# Patient Record
Sex: Female | Born: 1985
Health system: Southern US, Community
[De-identification: ages and names within clinical notes are randomized; demographics above are authoritative.]

## PROBLEM LIST (undated history)

## (undated) DIAGNOSIS — J189 Pneumonia, unspecified organism: Secondary | ICD-10-CM

## (undated) DIAGNOSIS — O2230 Deep phlebothrombosis in pregnancy, unspecified trimester: Secondary | ICD-10-CM

## (undated) DIAGNOSIS — J302 Other seasonal allergic rhinitis: Secondary | ICD-10-CM

## (undated) DIAGNOSIS — F419 Anxiety disorder, unspecified: Secondary | ICD-10-CM

## (undated) DIAGNOSIS — K219 Gastro-esophageal reflux disease without esophagitis: Secondary | ICD-10-CM

## (undated) DIAGNOSIS — G8929 Other chronic pain: Secondary | ICD-10-CM

## (undated) DIAGNOSIS — D649 Anemia, unspecified: Secondary | ICD-10-CM

## (undated) DIAGNOSIS — R011 Cardiac murmur, unspecified: Secondary | ICD-10-CM

## (undated) DIAGNOSIS — F32A Depression, unspecified: Secondary | ICD-10-CM

## (undated) DIAGNOSIS — R51 Headache: Secondary | ICD-10-CM

## (undated) HISTORY — DX: Cardiac murmur, unspecified: R01.1

## (undated) HISTORY — PX: VARICOSE VEIN SURGERY: SHX832

## (undated) HISTORY — DX: Headache: R51

## (undated) HISTORY — PX: DILATION AND CURETTAGE OF UTERUS: SHX78

## (undated) HISTORY — DX: Other chronic pain: G89.29

## (undated) HISTORY — PX: WISDOM TOOTH EXTRACTION: SHX21

## (undated) HISTORY — DX: Other seasonal allergic rhinitis: J30.2

---

## 2007-05-23 ENCOUNTER — Emergency Department (HOSPITAL_COMMUNITY): Admission: EM | Admit: 2007-05-23 | Discharge: 2007-05-23 | Payer: Self-pay | Admitting: Emergency Medicine

## 2012-02-13 ENCOUNTER — Other Ambulatory Visit: Payer: Self-pay | Admitting: Gastroenterology

## 2012-02-13 DIAGNOSIS — R109 Unspecified abdominal pain: Secondary | ICD-10-CM

## 2012-02-20 ENCOUNTER — Ambulatory Visit
Admission: RE | Admit: 2012-02-20 | Discharge: 2012-02-20 | Disposition: A | Discharge status: Home / Self Care | Payer: BC Managed Care – PPO | Source: Ambulatory Visit | Attending: Gastroenterology | Admitting: Gastroenterology

## 2012-02-20 DIAGNOSIS — R109 Unspecified abdominal pain: Secondary | ICD-10-CM

## 2013-01-30 ENCOUNTER — Other Ambulatory Visit: Payer: Self-pay | Admitting: Family Medicine

## 2013-05-24 ENCOUNTER — Other Ambulatory Visit: Payer: Self-pay | Admitting: Family Medicine

## 2013-12-22 IMAGING — US US ABDOMEN COMPLETE
1 series · 14 of 25 positions shown · non-contrast
Comparison: None.

CLINICAL DATA: Generalized abdominal pain.  Nausea, vomiting and
diarrhea after eating.

COMPLETE ABDOMINAL ULTRASOUND

[Series 1: us abdomen complete · 0.22mm/px · 14 of 74 slices shown]
[im 1/74]
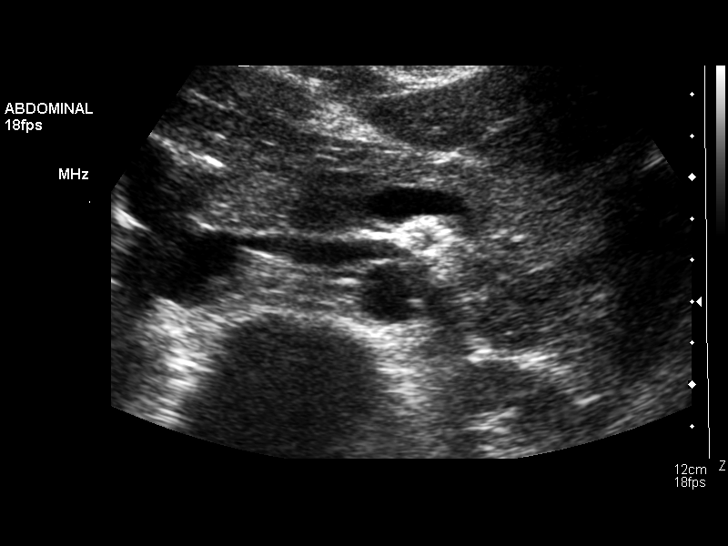
[im 7/74]
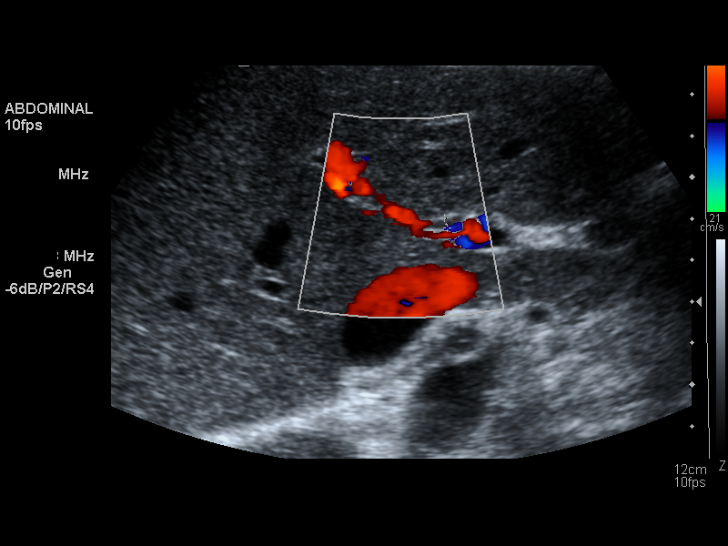
[im 13/74]
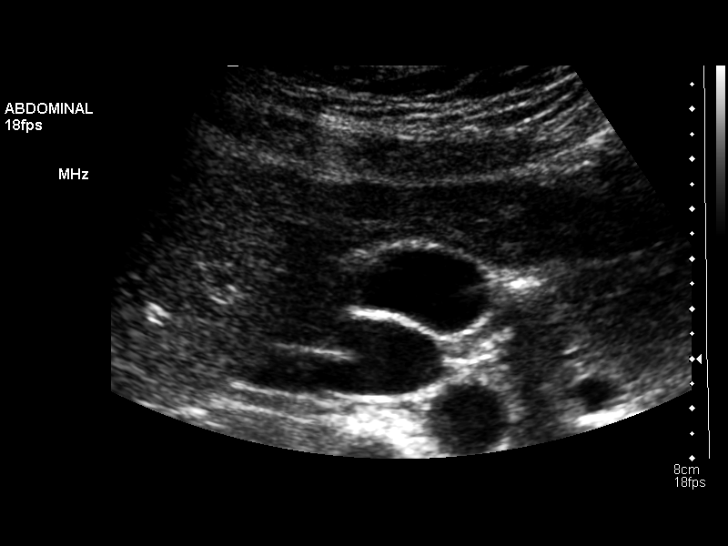
[im 19/74]
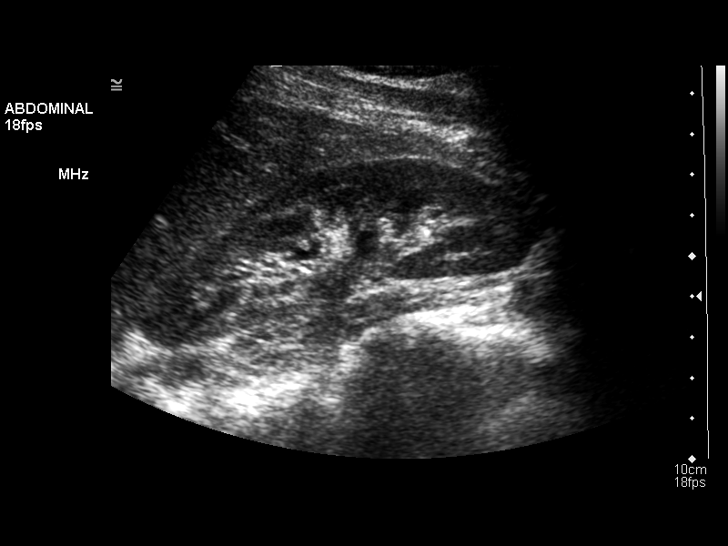
[im 25/74]
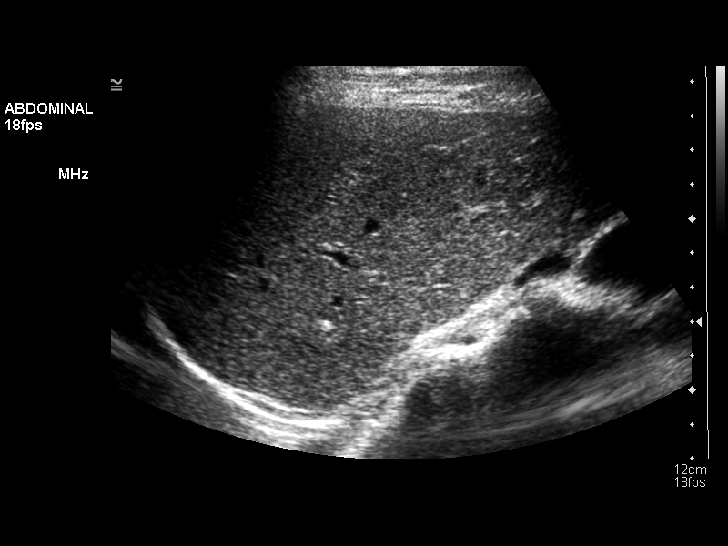
[im 28/74]
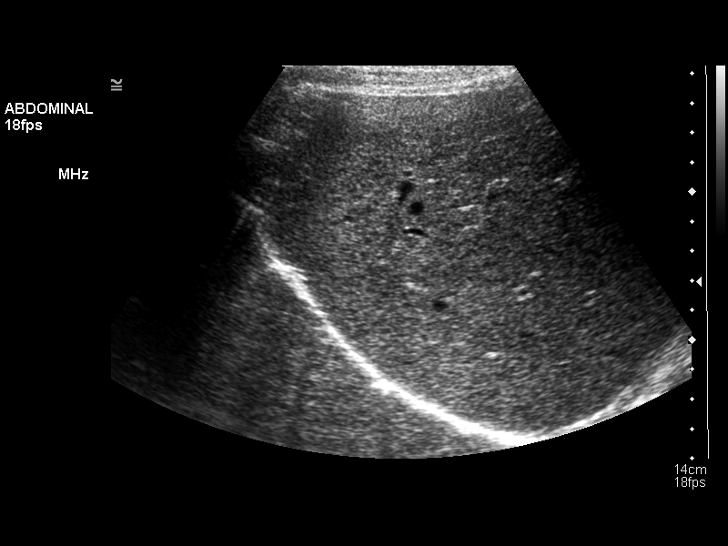
[im 34/74]
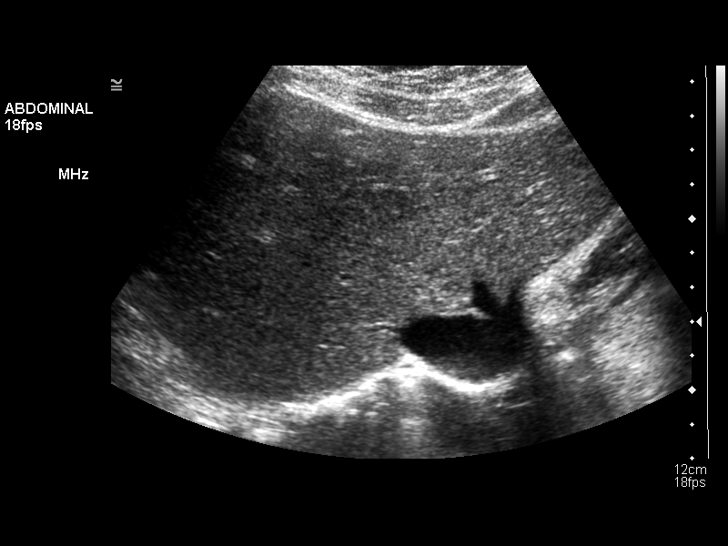
[im 40/74]
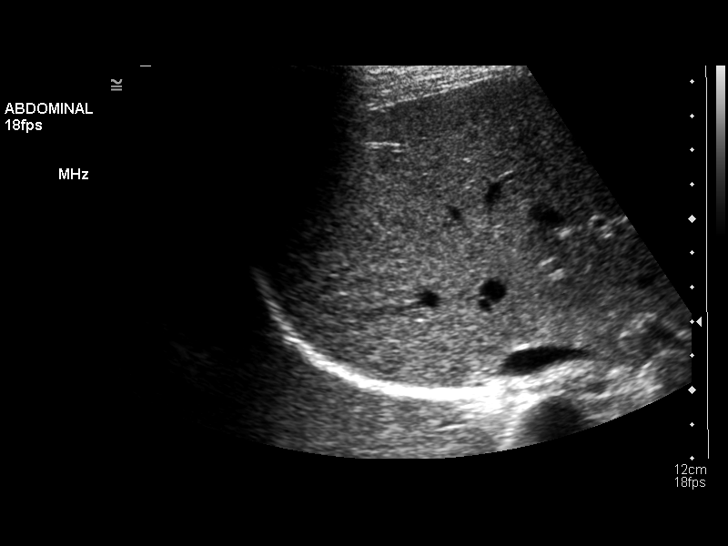
[im 46/74]
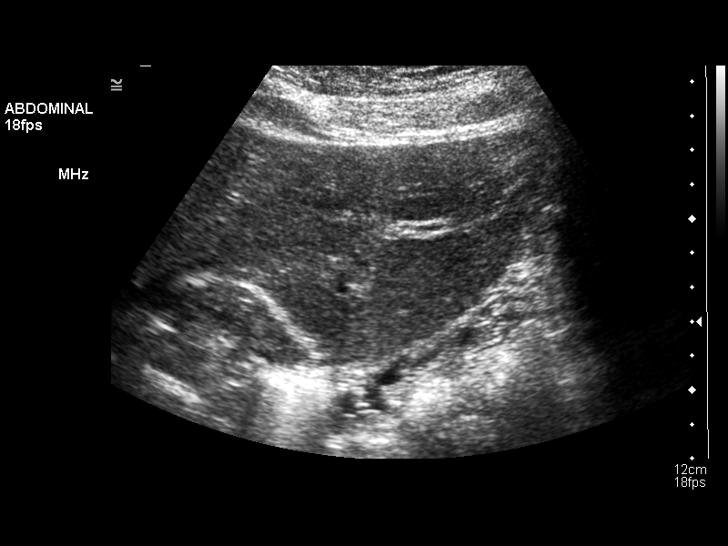
[im 49/74]
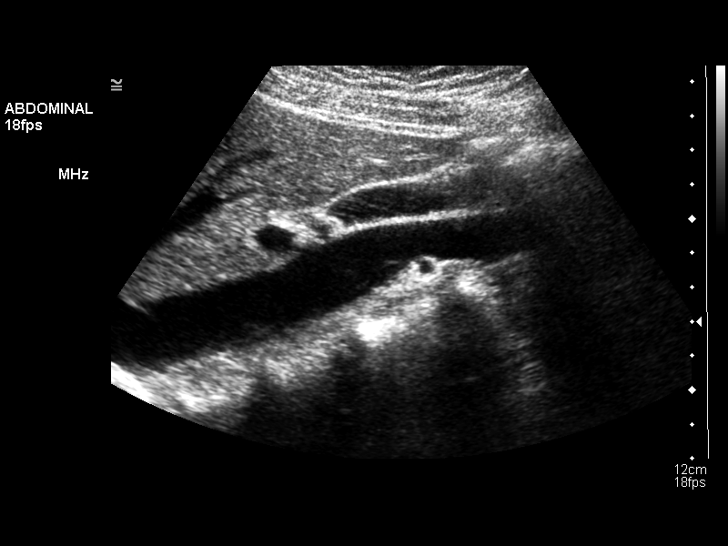
[im 55/74]
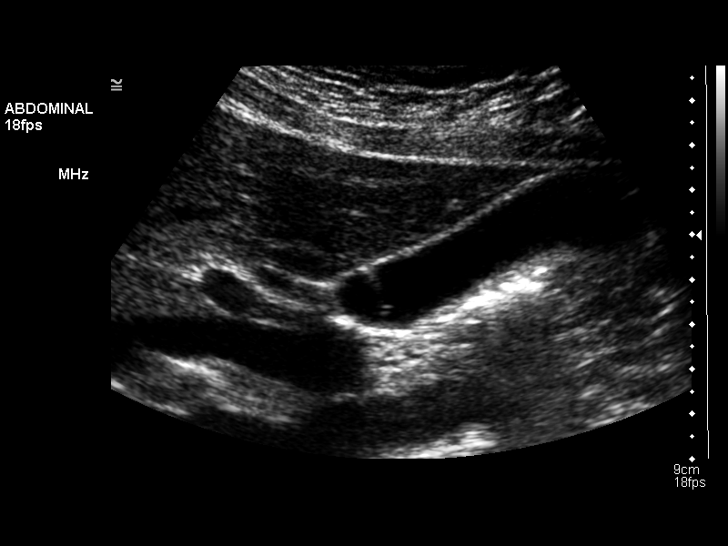
[im 61/74]
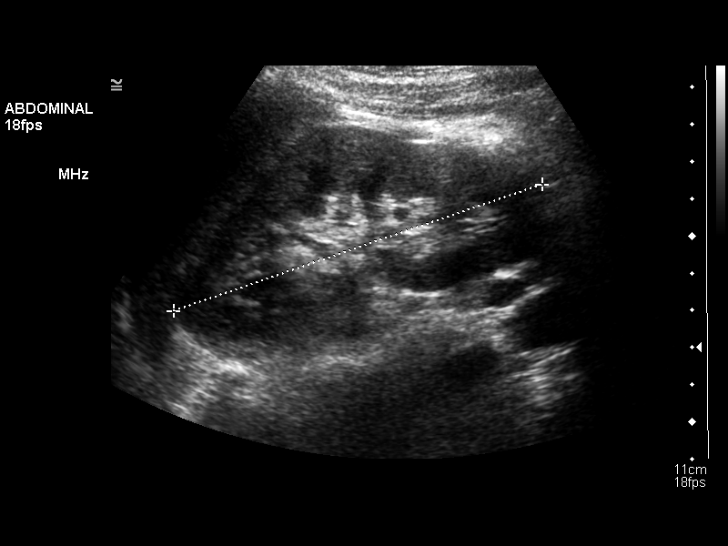
[im 67/74]
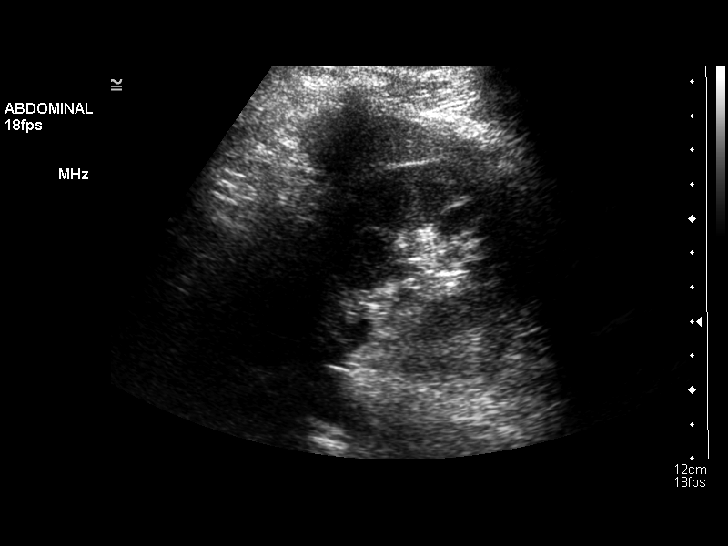
[im 74/74]
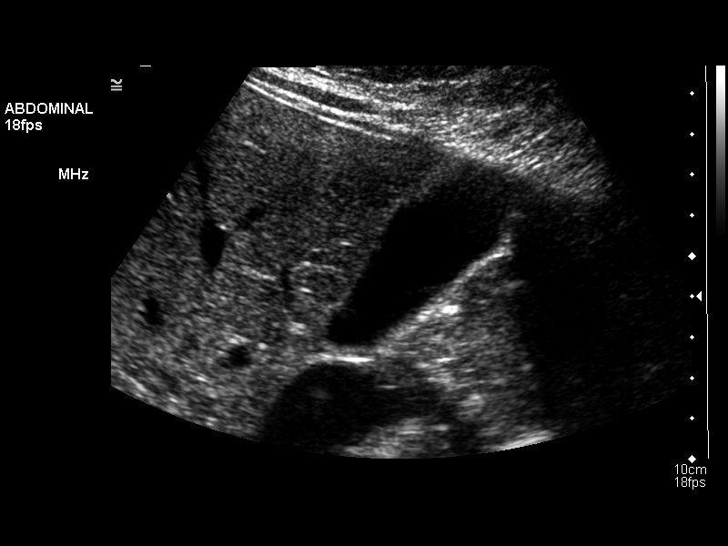

[14 of 25 positions shown; findings below may reference images not displayed]

FINDINGS: Gallbladder:  No gallstones, gallbladder wall thickening, or
pericholecystic fluid.

Common bile duct:  2.5 mm.

Liver:  No focal lesion identified.  Within normal limits in
parenchymal echogenicity.

IVC:  Appears normal.

Pancreas:  Pancreatic tail poorly delineated secondary to overlying
bowel gas.  Remainder pancreas unremarkable.

Spleen:  8.5 cm.  No focal mass.

Right Kidney:  10.5 cm. No hydronephrosis or renal mass..

Left Kidney:  10.5 cm. No hydronephrosis or renal mass..

Abdominal aorta:  Mid aspect obscured by bowel gas otherwise
unremarkable.
IMPRESSION: Pancreatic tail and mid aspect of the abdominal aorta obscured by
bowel gas otherwise unremarkable exam.  Specifically, no evidence
of gallstones.

## 2014-10-29 ENCOUNTER — Encounter: Payer: Self-pay | Admitting: Internal Medicine

## 2014-10-29 ENCOUNTER — Telehealth: Payer: Self-pay | Admitting: Internal Medicine

## 2014-10-29 ENCOUNTER — Ambulatory Visit (INDEPENDENT_AMBULATORY_CARE_PROVIDER_SITE_OTHER): Payer: BLUE CROSS/BLUE SHIELD | Admitting: Internal Medicine

## 2014-10-29 ENCOUNTER — Encounter (INDEPENDENT_AMBULATORY_CARE_PROVIDER_SITE_OTHER): Payer: Self-pay

## 2014-10-29 VITALS — BP 100/60 | HR 47 | Ht 64.0 in | Wt 152.0 lb

## 2014-10-29 DIAGNOSIS — J302 Other seasonal allergic rhinitis: Secondary | ICD-10-CM

## 2014-10-29 DIAGNOSIS — H1013 Acute atopic conjunctivitis, bilateral: Secondary | ICD-10-CM

## 2014-10-29 DIAGNOSIS — J309 Allergic rhinitis, unspecified: Secondary | ICD-10-CM

## 2014-10-29 DIAGNOSIS — J3089 Other allergic rhinitis: Principal | ICD-10-CM

## 2014-10-29 NOTE — Patient Instructions (Addendum)
Sample Dymista nasal spray    1-2 puffs each night at bedtime  Our allergy nurse will call to schedule allergy skin testing with reminder to be off all antihistamines, including the Dymista, for 3 days before the testing.

## 2014-10-29 NOTE — Telephone Encounter (Signed)
Pt has appt for skin testing scheduled on Wednesday 11-18-14 at 2:30pm. She has been made aware of allergy testing protocol as well. Nothing more needed at this time.

## 2014-10-29 NOTE — Progress Notes (Signed)
10/29/14- 29 yo F never smoker: Pt is a self referral for seasonal allergies. Pt c/o sneezing, coughing, and hoarness. Sinus pressure/drainage, headaches. States it keeps her from work at times. She has not had adequate control with Flonase and available OTC antihistamines. Long history of seasonal allergic rhinitis, nasal congestion and drainage, eye irritation and watering. Some perennial symptoms. No ENT surgery. Denies asthma, food or insect sensitivity, intolerance of aspirin, skin rash. Environment-dry apartment, carpet, no mold, has a cat, no smokers.  Prior to Admission medications   Medication Sig Start Date End Date Taking? Authorizing Provider  loratadine (ALLERGY) 10 MG tablet Take 10 mg by mouth daily.   Yes Historical Provider, MD  norethindrone-ethinyl estradiol-iron (ESTROSTEP FE,TILIA FE,TRI-LEGEST FE) 1-20/1-30/1-35 MG-MCG tablet Take 1 tablet by mouth daily.   Yes Historical Provider, MD  OVER THE COUNTER MEDICATION Take 1 tablet by mouth daily. Thermo-plus   Yes Historical Provider, MD  Probiotic Product (PROBIOTIC ADVANCED PO) Take 1 tablet by mouth daily.   Yes Historical Provider, MD   Past Medical History  Diagnosis Date  . Seasonal allergies   . Chronic headaches   . Heart murmur    No past surgical history on file. No family history on file. History   Social History  . Marital Status: Single    Spouse Name: N/A  . Number of Children: N/A  . Years of Education: N/A   Occupational History  . Adminstrative     Social History Main Topics  . Smoking status: Never Smoker   . Smokeless tobacco: Not on file  . Alcohol Use: 0.6 oz/week    1 Standard drinks or equivalent per week  . Drug Use: Not on file  . Sexual Activity: Not on file   Other Topics Concern  . Not on file   Social History Narrative  . No narrative on file   ROS-see HPI   Negative unless "+" Constitutional:    weight loss, night sweats, fevers, chills, fatigue, lassitude. HEENT:     headaches, difficulty swallowing, tooth/dental problems, sore throat,       +sneezing, +itching, ear ache, +nasal congestion, +post nasal drip, snoring CV:    chest pain, orthopnea, PND, swelling in lower extremities, anasarca,                                  dizziness, palpitations Resp:   shortness of breath with exertion or at rest.                productive cough,   non-productive cough, coughing up of blood.              change in color of mucus.  wheezing.   Skin:    rash or lesions. GI:  No-   heartburn, indigestion, abdominal pain, nausea, vomiting, diarrhea,                 change in bowel habits, loss of appetite GU: dysuria, change in color of urine, no urgency or frequency.   flank pain. MS:   joint pain, stiffness, decreased range of motion, back pain. Neuro-     nothing unusual Psych:  change in mood or affect.  depression or anxiety.   memory loss.  OBJ- Physical Exam General- Alert, Oriented, Affect-appropriate, Distress- none acute Skin- rash-none, lesions- none, excoriation- none Lymphadenopathy- none Head- atraumatic            Eyes- Gross  vision intact, PERRLA, conjunctivae and secretions clear            Ears- Hearing, canals-normal            Nose- + wet edematous, no-Septal dev, mucus, polyps, erosion, perforation             Throat- Mallampati II , mucosa clear , drainage- none, tonsils- atrophic Neck- flexible , trachea midline, no stridor , thyroid nl, carotid no bruit Chest - symmetrical excursion , unlabored           Heart/CV- RRR , no murmur , no gallop  , no rub, nl s1 s2                           - JVD- none , edema- none, stasis changes- none, varices- none           Lung- clear to P&A, wheeze- none, cough- none , dullness-none, rub- none           Chest wall-  Abd-  Br/ Gen/ Rectal- Not done, not indicated Extrem- cyanosis- none, clubbing, none, atrophy- none, strength- nl Neuro- grossly intact to observation

## 2014-10-29 NOTE — Progress Notes (Signed)
Subjective:    Patient ID: Ashley Jensen, female    DOB: 12-14-1985, 29 y.o.   MRN: [id number]  HPI    Review of Systems  Constitutional: Negative for fever and unexpected weight change.  HENT: Positive for ear pain, postnasal drip, sinus pressure, sneezing and sore throat. Negative for congestion, dental problem, nosebleeds, rhinorrhea and trouble swallowing.   Eyes: Positive for redness and itching.  Respiratory: Negative for cough, chest tightness, shortness of breath and wheezing.   Cardiovascular: Negative for palpitations and leg swelling.  Gastrointestinal: Negative for nausea and vomiting.  Genitourinary: Negative for dysuria.  Musculoskeletal: Negative for joint swelling.  Skin: Negative for rash.  Neurological: Positive for headaches.  Hematological: Does not bruise/bleed easily.  Psychiatric/Behavioral: Negative for dysphoric mood. The patient is not nervous/anxious.        Objective:   Physical Exam        Assessment & Plan:

## 2014-10-29 NOTE — Telephone Encounter (Signed)
Pt returning call.Ashley Jensen ° °

## 2014-11-01 DIAGNOSIS — J302 Other seasonal allergic rhinitis: Secondary | ICD-10-CM | POA: Insufficient documentation

## 2014-11-01 DIAGNOSIS — J3089 Other allergic rhinitis: Principal | ICD-10-CM

## 2014-11-01 DIAGNOSIS — H1010 Acute atopic conjunctivitis, unspecified eye: Secondary | ICD-10-CM | POA: Insufficient documentation

## 2014-11-01 NOTE — Assessment & Plan Note (Signed)
History consistent with allergic rhinitis Plan-environmental dust and pollen precautions, sample Dymista nasal spray, schedule allergy skin testing off antihistamines

## 2014-11-01 NOTE — Assessment & Plan Note (Signed)
Educated OTC allergy eyedrops, environmental controls

## 2014-11-18 ENCOUNTER — Ambulatory Visit (INDEPENDENT_AMBULATORY_CARE_PROVIDER_SITE_OTHER): Payer: BLUE CROSS/BLUE SHIELD | Admitting: Internal Medicine

## 2014-11-18 ENCOUNTER — Encounter: Payer: Self-pay | Admitting: Internal Medicine

## 2014-11-18 VITALS — BP 116/68 | HR 65 | Ht 64.0 in | Wt 153.8 lb

## 2014-11-18 DIAGNOSIS — J309 Allergic rhinitis, unspecified: Secondary | ICD-10-CM

## 2014-11-18 DIAGNOSIS — J302 Other seasonal allergic rhinitis: Secondary | ICD-10-CM

## 2014-11-18 DIAGNOSIS — J3089 Other allergic rhinitis: Principal | ICD-10-CM

## 2014-11-18 DIAGNOSIS — H1011 Acute atopic conjunctivitis, right eye: Secondary | ICD-10-CM

## 2014-11-18 DIAGNOSIS — H1013 Acute atopic conjunctivitis, bilateral: Secondary | ICD-10-CM | POA: Diagnosis not present

## 2014-11-18 NOTE — Progress Notes (Signed)
10/29/14- 29 yo F never smoker: Pt is a self referral for seasonal allergies. Pt c/o sneezing, coughing, and hoarness. Sinus pressure/drainage, headaches. States it keeps her from work at times. She has not had adequate control with Flonase and available OTC antihistamines. Long history of seasonal allergic rhinitis, nasal congestion and drainage, eye irritation and watering. Some perennial symptoms. No ENT surgery. Denies asthma, food or insect sensitivity, intolerance of aspirin, skin rash. Environment-dry apartment, carpet, no mold, has a cat, no smokers.  11/18/14- 29 yo F never smoker: Pt is a self referral for seasonal allergies For allergy skin testing: No antihistamines, OTC cough syrups, or OTC sleep aids in past 3 days. Spring and Fall are her problem seasons. Summer is ok, without need for meds recently.  Allergy skin testing 11/18/14- positive for grass, weed, tree pollens, dust mite, some molds. She is going to check with her insurance about allergy vaccine.  ROS-see HPI   Negative unless "+" Constitutional:    weight loss, night sweats, fevers, chills, fatigue, lassitude. HEENT:    headaches, difficulty swallowing, tooth/dental problems, sore throat,       +sneezing, +itching, ear ache, +nasal congestion, +post nasal drip, snoring CV:    chest pain, orthopnea, PND, swelling in lower extremities, anasarca,                                  dizziness, palpitations Resp:   shortness of breath with exertion or at rest.                productive cough,   non-productive cough, coughing up of blood.              change in color of mucus.  wheezing.   Skin:    rash or lesions. GI:  No-   heartburn, indigestion, abdominal pain, nausea, vomiting,  GU:  MS:   joint pain, stiffness, . Neuro-     nothing unusual Psych:  change in mood or affect.  depression or anxiety.   memory loss.  OBJ- Physical Exam General- Alert, Oriented, Affect-appropriate, Distress- none acute Skin- rash-none, lesions-  none, excoriation- none Lymphadenopathy- none Head- atraumatic            Eyes- Gross vision intact, PERRLA, conjunctivae and secretions clear            Ears- Hearing, canals-normal            Nose- + wet edematous turbinates, no-Septal dev, mucus, polyps, erosion, perforation             Throat- Mallampati II , mucosa clear , drainage- none, tonsils- atrophic Neck- flexible , trachea midline, no stridor , thyroid nl, carotid no bruit Chest - symmetrical excursion , unlabored           Heart/CV- RRR , no murmur , no gallop  , no rub, nl s1 s2                           - JVD- none , edema- none, stasis changes- none, varices- none           Lung- clear to P&A, wheeze- none, cough- none , dullness-none, rub- none           Chest wall-  Abd-  Br/ Gen/ Rectal- Not done, not indicated Extrem- cyanosis- none, clubbing, none, atrophy- none, strength- nl Neuro- grossly intact to observation

## 2014-11-18 NOTE — Patient Instructions (Signed)
If you decide you would like to start allergy shots, please let us know and we will get the prescription to the allergy lab.   Please call as needed

## 2014-11-19 NOTE — Assessment & Plan Note (Signed)
We had a balanced discussion of treatment options beyond environmental controls and otc meds. She is interested in allergy vaccine, which has helped friends. We discussed realistic risk-benefit including potential for sever allergic reactions up to death, and our protocols. Plan- She will check with her insurance, and call back if she decides to start allergy vaccine.

## 2014-11-23 ENCOUNTER — Encounter: Payer: Self-pay | Admitting: Internal Medicine

## 2015-01-11 ENCOUNTER — Telehealth: Payer: Self-pay | Admitting: Internal Medicine

## 2015-01-11 NOTE — Telephone Encounter (Signed)
Per 11/18/14 OV: Patient Instructions       If you decide you would like to start allergy shots, please let us know and we will get the prescription to the allergy lab.   Please call as needed  ---  lmtcb x1

## 2015-01-11 NOTE — Telephone Encounter (Signed)
Spoke with pt.  States she is not able to come in weekly right now for allergy injections.  Pt states it was discussed that a  Allergy tablet rx could be sent in if she decided not to proceed with Allergy injections.  She wasn't sure what medication was discussed.  Dr. Maple Hudson, please advise.  Thank you.  [facility].  Please leave detailed msg on pt's VM if no answer per her request.

## 2015-01-11 NOTE — Telephone Encounter (Signed)
Pt returned call [phone]  Please leave vm if not available

## 2015-01-12 NOTE — Telephone Encounter (Signed)
lmomtcb x1 

## 2015-01-12 NOTE — Telephone Encounter (Signed)
Left message for pt to call back  °

## 2015-01-12 NOTE — Addendum Note (Signed)
Addended by: Nicanor Alcon on: 01/12/2015 11:48 AM   Modules accepted: Orders

## 2015-01-12 NOTE — Telephone Encounter (Addendum)
Called and informed her of the recs per CY. Pt verbalized understanding.   Dr. Maple Hudson please advise if these medications are over the counter. If not, what is the strength and frequency should the medication be called in as.   (Closed in error)

## 2015-01-12 NOTE — Telephone Encounter (Signed)
Pt cb, please cb at previous number listed °

## 2015-01-12 NOTE — Telephone Encounter (Signed)
There is a pill called Grasstek for grass allergy and a pill called Ragwitek for ragweed. These need to be started 3-4 months before the respective pollen season. Ashley Jensen would be started in December and Ragwitek in May or June. The earliest we could do would be to bring her back in December to talk about Grasstek. Otherwise we work with meds .

## 2015-01-12 NOTE — Telephone Encounter (Signed)
The Grasstek and Ragwitek are prescription medicines that are prescribed for use starting 3-4 months before the season and taken through the spring or fall allergy months. For otc use without prescription, I usually recommend a daily antihistamine like allegra, zyrtec or claritin. She can use an otc nasal steroid spray like flonase or nasacort, and an allergy eye drop like Allaway, Naphcon.

## 2015-07-13 ENCOUNTER — Telehealth: Payer: Self-pay | Admitting: Internal Medicine

## 2015-07-13 MED ORDER — TIMOTHY GRASS POLLEN ALLERGEN 2800 BAU SL SUBL: 1.0000 | SUBLINGUAL_TABLET | Freq: Every day | SUBLINGUAL | Status: DC

## 2015-07-13 MED ORDER — SHORT RAGWEED POLLEN EXT 12 AMB A 1-U SL SUBL: 1.0000 | SUBLINGUAL_TABLET | Freq: Every day | SUBLINGUAL | Status: DC

## 2015-07-13 NOTE — Telephone Encounter (Signed)
Spoke with the pt  She is calling to get rxs for Ragwitek and Grasstec  Please advise if okay to send, and if so what are the directions? She wants these sent to the [facility] on [address]  Please advise, thanks! Allergies  Allergen Reactions  . Latex Itching   Current Outpatient Prescriptions on File Prior to Visit  Medication Sig Dispense Refill  . loratadine (ALLERGY) 10 MG tablet Take 10 mg by mouth daily.    . norethindrone-ethinyl estradiol-iron (ESTROSTEP FE,TILIA FE,TRI-LEGEST FE) 1-20/1-30/1-35 MG-MCG tablet Take 1 tablet by mouth daily.    Marland Kitchen OVER THE COUNTER MEDICATION Take 1 tablet by mouth daily. Thermo-plus    . Probiotic Product (PROBIOTIC ADVANCED PO) Take 1 tablet by mouth daily.     No current facility-administered medications on file prior to visit.

## 2015-07-13 NOTE — Telephone Encounter (Signed)
Rx for Grasstek, # 30, refill x 4     1 under tongue daily through grass pollen season ending June 15  Rx for Ragwitek, # 30 refill x 4      1 under tongue daily from June 15 through October 15

## 2015-07-13 NOTE — Telephone Encounter (Signed)
Pt is aware of Rx's per CY and stated she would [facility] have Rx's sent to [facility] at  [address] and [address]. Nothing more needed at this time.

## 2015-07-14 ENCOUNTER — Telehealth: Payer: Self-pay | Admitting: Internal Medicine

## 2015-07-14 MED ORDER — AZELASTINE HCL 0.1 % NA SOLN: 1.0000 | Freq: Every day | NASAL | Status: DC

## 2015-07-14 MED ORDER — TIMOTHY GRASS POLLEN ALLERGEN 2800 BAU SL SUBL: 1.0000 | SUBLINGUAL_TABLET | Freq: Every day | SUBLINGUAL | Status: DC

## 2015-07-14 MED ORDER — FLUTICASONE PROPIONATE 50 MCG/ACT NA SUSP: 1.0000 | Freq: Every day | NASAL | Status: DC

## 2015-07-14 MED ORDER — SHORT RAGWEED POLLEN EXT 12 AMB A 1-U SL SUBL: 1.0000 | SUBLINGUAL_TABLET | Freq: Every day | SUBLINGUAL | Status: DC

## 2015-07-14 NOTE — Telephone Encounter (Signed)
Spoke with pt, aware of rx's being sent in.  Nothing further needed.

## 2015-07-14 NOTE — Telephone Encounter (Signed)
Spoke with pt. She needs Ragwitek and Grastek sent in for 90 day supplies. These have been re-sent. Dymista is not covered by her insurance will need alternative. Advised her that we could prescribed Astelin and Fluticasone to make up the Dymista. She is fine with having 2 medications.  CY - please advise if we can do this. Thanks.

## 2015-07-14 NOTE — Telephone Encounter (Signed)
Ok to Rx flonase, # 1, 1-2 puffs each nostril once daily at bedtime Ref x 12                   Astelin # 1,   1-2 puffs each nostril once daily at bedtime Ref x 12

## 2015-07-20 ENCOUNTER — Telehealth: Payer: Self-pay | Admitting: *Deleted

## 2015-07-20 NOTE — Telephone Encounter (Signed)
Initiated PA for [employer]ek thru [facility]. Key: [id number] Pt ID: [id number]  [facility]

## 2015-07-21 NOTE — Telephone Encounter (Signed)
Grasstek does not require PA per [facility]. Pharmacy informed.

## 2015-08-16 DIAGNOSIS — F418 Other specified anxiety disorders: Secondary | ICD-10-CM | POA: Diagnosis not present

## 2015-08-16 DIAGNOSIS — R635 Abnormal weight gain: Secondary | ICD-10-CM | POA: Diagnosis not present

## 2015-09-06 ENCOUNTER — Telehealth: Payer: Self-pay | Admitting: Internal Medicine

## 2015-09-06 MED ORDER — AZELASTINE-FLUTICASONE 137-50 MCG/ACT NA SUSP: 1.0000 | Freq: Every day | NASAL | Status: DC

## 2015-09-06 NOTE — Telephone Encounter (Signed)
Ok to Rx Dymista nasal spray, # 1,     1-2 puffs each nostril once daily at bedtime, refill x 12

## 2015-09-06 NOTE — Telephone Encounter (Signed)
Spoke with pt, aware of recs.  rx sent to preferred pharmacy.  Nothing further needed.  

## 2015-09-06 NOTE — Telephone Encounter (Signed)
Spoke with pt and she states that she has coupon for Dymista and [facility] [place].   CY ok to change rx to Dymista? Please advise. Thank you!  Allergies  Allergen Reactions  . Latex Itching   Current Outpatient Prescriptions on File Prior to Visit  Medication Sig Dispense Refill  . azelastine (ASTELIN) 0.1 % nasal spray Place 1-2 sprays into both nostrils at bedtime. Use in each nostril as directed 30 mL 12  . fluticasone (FLONASE) 50 MCG/ACT nasal spray Place 1-2 sprays into both nostrils at bedtime. 16 g 12  . loratadine (ALLERGY) 10 MG tablet Take 10 mg by mouth daily.    . norethindrone-ethinyl estradiol-iron (ESTROSTEP FE,TILIA FE,TRI-LEGEST FE) 1-20/1-30/1-35 MG-MCG tablet Take 1 tablet by mouth daily.    Marland Kitchen. OVER THE COUNTER MEDICATION Take 1 tablet by mouth daily. Thermo-plus    . Probiotic Product (PROBIOTIC ADVANCED PO) Take 1 tablet by mouth daily.    Malachi Bonds. Short Ragweed Pollen Ext (RAGWITEK) 12 AMB A 1-U SUBL Place 1 tablet under the tongue daily. From June 15 through October 15 90 tablet 1  . Timothy Grass Pollen Allergen (GRASTEK) 2800 BAU SUBL Place 1 tablet under the tongue daily. through grass pollen season ending June 15 90 tablet 1   No current facility-administered medications on file prior to visit.

## 2015-09-15 DIAGNOSIS — J029 Acute pharyngitis, unspecified: Secondary | ICD-10-CM | POA: Diagnosis not present

## 2015-11-09 DIAGNOSIS — M531 Cervicobrachial syndrome: Secondary | ICD-10-CM | POA: Diagnosis not present

## 2015-11-09 DIAGNOSIS — M9902 Segmental and somatic dysfunction of thoracic region: Secondary | ICD-10-CM | POA: Diagnosis not present

## 2015-11-09 DIAGNOSIS — R51 Headache: Secondary | ICD-10-CM | POA: Diagnosis not present

## 2015-11-09 DIAGNOSIS — M9901 Segmental and somatic dysfunction of cervical region: Secondary | ICD-10-CM | POA: Diagnosis not present

## 2015-12-29 DIAGNOSIS — I872 Venous insufficiency (chronic) (peripheral): Secondary | ICD-10-CM | POA: Diagnosis not present

## 2015-12-29 DIAGNOSIS — I83813 Varicose veins of bilateral lower extremities with pain: Secondary | ICD-10-CM | POA: Diagnosis not present

## 2016-01-11 DIAGNOSIS — H04123 Dry eye syndrome of bilateral lacrimal glands: Secondary | ICD-10-CM | POA: Diagnosis not present

## 2016-01-19 DIAGNOSIS — I83813 Varicose veins of bilateral lower extremities with pain: Secondary | ICD-10-CM | POA: Diagnosis not present

## 2016-01-19 DIAGNOSIS — I872 Venous insufficiency (chronic) (peripheral): Secondary | ICD-10-CM | POA: Diagnosis not present

## 2016-02-01 DIAGNOSIS — M25561 Pain in right knee: Secondary | ICD-10-CM | POA: Diagnosis not present

## 2016-02-01 DIAGNOSIS — I872 Venous insufficiency (chronic) (peripheral): Secondary | ICD-10-CM | POA: Diagnosis not present

## 2016-02-01 DIAGNOSIS — I83813 Varicose veins of bilateral lower extremities with pain: Secondary | ICD-10-CM | POA: Diagnosis not present

## 2016-02-11 DIAGNOSIS — M5136 Other intervertebral disc degeneration, lumbar region: Secondary | ICD-10-CM | POA: Diagnosis not present

## 2016-02-11 DIAGNOSIS — M222X9 Patellofemoral disorders, unspecified knee: Secondary | ICD-10-CM | POA: Diagnosis not present

## 2016-02-11 DIAGNOSIS — Z6826 Body mass index (BMI) 26.0-26.9, adult: Secondary | ICD-10-CM | POA: Diagnosis not present

## 2016-02-29 DIAGNOSIS — Z01419 Encounter for gynecological examination (general) (routine) without abnormal findings: Secondary | ICD-10-CM | POA: Diagnosis not present

## 2016-02-29 DIAGNOSIS — G43119 Migraine with aura, intractable, without status migrainosus: Secondary | ICD-10-CM | POA: Diagnosis not present

## 2016-02-29 DIAGNOSIS — Z6827 Body mass index (BMI) 27.0-27.9, adult: Secondary | ICD-10-CM | POA: Diagnosis not present

## 2016-04-10 DIAGNOSIS — L989 Disorder of the skin and subcutaneous tissue, unspecified: Secondary | ICD-10-CM | POA: Diagnosis not present

## 2016-04-19 DIAGNOSIS — M9901 Segmental and somatic dysfunction of cervical region: Secondary | ICD-10-CM | POA: Diagnosis not present

## 2016-04-19 DIAGNOSIS — M9902 Segmental and somatic dysfunction of thoracic region: Secondary | ICD-10-CM | POA: Diagnosis not present

## 2016-04-19 DIAGNOSIS — M9908 Segmental and somatic dysfunction of rib cage: Secondary | ICD-10-CM | POA: Diagnosis not present

## 2016-04-19 DIAGNOSIS — M531 Cervicobrachial syndrome: Secondary | ICD-10-CM | POA: Diagnosis not present

## 2016-04-22 DIAGNOSIS — J029 Acute pharyngitis, unspecified: Secondary | ICD-10-CM | POA: Diagnosis not present

## 2016-04-22 DIAGNOSIS — H6123 Impacted cerumen, bilateral: Secondary | ICD-10-CM | POA: Diagnosis not present

## 2016-05-02 DIAGNOSIS — D2239 Melanocytic nevi of other parts of face: Secondary | ICD-10-CM | POA: Diagnosis not present

## 2016-05-30 DIAGNOSIS — F40243 Fear of flying: Secondary | ICD-10-CM | POA: Diagnosis not present

## 2016-06-06 ENCOUNTER — Telehealth: Payer: Self-pay | Admitting: Internal Medicine

## 2016-06-06 MED ORDER — TIMOTHY GRASS POLLEN ALLERGEN 2800 BAU SL SUBL: 1.0000 | SUBLINGUAL_TABLET | Freq: Every day | SUBLINGUAL | 1 refills | Status: DC

## 2016-06-06 MED ORDER — SHORT RAGWEED POLLEN EXT 12 AMB A 1-U SL SUBL: 1.0000 | SUBLINGUAL_TABLET | Freq: Every day | SUBLINGUAL | 1 refills | Status: DC

## 2016-06-06 NOTE — Telephone Encounter (Signed)
CY pt is requesting a refill of her ragwitek and grastek.    She stated that she is now aware that CY sent out letters and shelby has mailed a new letter to this pt.  Pt does not have an upcoming appt with CY and has not been seen since 2016.  Please advise if ok to send in refill of the vaccine.  Thanks  Allergies  Allergen Reactions  . Latex Itching

## 2016-06-06 NOTE — Telephone Encounter (Signed)
I also mailed letter to patient's new address that Dr. Maple HudsonYoung sent out to all of his allergy patients.  Patient is aware she will need to establish with new allergy physician.

## 2016-06-06 NOTE — Telephone Encounter (Signed)
These are actually pills that function like allergy vaccine. Ok to refill this time, but she needs to schedule and keep a follow-up appointment with me this spring, or establish with another allergy practice ([facility] [place] on [address])

## 2016-06-06 NOTE — Telephone Encounter (Signed)
Called and spoke with pt and she is aware of refills of these meds that have been sent in for 90 day supply.  Nothing further is needed.

## 2016-08-24 DIAGNOSIS — N911 Secondary amenorrhea: Secondary | ICD-10-CM | POA: Diagnosis not present

## 2016-09-11 DIAGNOSIS — Z3009 Encounter for other general counseling and advice on contraception: Secondary | ICD-10-CM | POA: Diagnosis not present

## 2016-12-24 ENCOUNTER — Emergency Department (HOSPITAL_COMMUNITY)
Admission: EM | Admit: 2016-12-24 | Discharge: 2016-12-24 | Disposition: A | Discharge status: Home / Self Care | Payer: BLUE CROSS/BLUE SHIELD | Attending: Emergency Medicine | Admitting: Emergency Medicine

## 2016-12-24 ENCOUNTER — Emergency Department (HOSPITAL_BASED_OUTPATIENT_CLINIC_OR_DEPARTMENT_OTHER)
Admit: 2016-12-24 | Discharge: 2016-12-24 | Disposition: A | Discharge status: Home / Self Care | Payer: BLUE CROSS/BLUE SHIELD | Attending: Emergency Medicine | Admitting: Emergency Medicine

## 2016-12-24 ENCOUNTER — Ambulatory Visit (HOSPITAL_COMMUNITY)
Admission: EM | Admit: 2016-12-24 | Discharge: 2016-12-24 | Disposition: A | Discharge status: Other Acute Inpatient Hospital | Payer: BLUE CROSS/BLUE SHIELD

## 2016-12-24 ENCOUNTER — Encounter (HOSPITAL_COMMUNITY): Payer: Self-pay | Admitting: Emergency Medicine

## 2016-12-24 DIAGNOSIS — X503XXA Overexertion from repetitive movements, initial encounter: Secondary | ICD-10-CM | POA: Insufficient documentation

## 2016-12-24 DIAGNOSIS — Z91040 Latex allergy status: Secondary | ICD-10-CM | POA: Diagnosis not present

## 2016-12-24 DIAGNOSIS — S4991XA Unspecified injury of right shoulder and upper arm, initial encounter: Secondary | ICD-10-CM | POA: Diagnosis not present

## 2016-12-24 DIAGNOSIS — M79609 Pain in unspecified limb: Secondary | ICD-10-CM | POA: Diagnosis not present

## 2016-12-24 DIAGNOSIS — S46911A Strain of unspecified muscle, fascia and tendon at shoulder and upper arm level, right arm, initial encounter: Secondary | ICD-10-CM | POA: Insufficient documentation

## 2016-12-24 DIAGNOSIS — Z79899 Other long term (current) drug therapy: Secondary | ICD-10-CM | POA: Insufficient documentation

## 2016-12-24 DIAGNOSIS — Y9389 Activity, other specified: Secondary | ICD-10-CM | POA: Insufficient documentation

## 2016-12-24 DIAGNOSIS — Y998 Other external cause status: Secondary | ICD-10-CM | POA: Insufficient documentation

## 2016-12-24 DIAGNOSIS — S46811A Strain of other muscles, fascia and tendons at shoulder and upper arm level, right arm, initial encounter: Secondary | ICD-10-CM | POA: Diagnosis not present

## 2016-12-24 DIAGNOSIS — Y929 Unspecified place or not applicable: Secondary | ICD-10-CM | POA: Insufficient documentation

## 2016-12-24 HISTORY — DX: Deep phlebothrombosis in pregnancy, unspecified trimester: O22.30

## 2016-12-24 MED ORDER — IBUPROFEN 600 MG PO TABS: 600.0000 mg | ORAL_TABLET | Freq: Four times a day (QID) | ORAL | 0 refills | Status: DC | PRN

## 2016-12-24 MED ORDER — SODIUM CHLORIDE 0.9 % IV BOLUS (SEPSIS): 1000.0000 mL | Freq: Once | INTRAVENOUS | Status: DC

## 2016-12-24 NOTE — Discharge Instructions (Signed)
We have not ruled out the possibility of rhabdomyolysis.  Either return here or go to your doctor to check your kidneys and a creatinine kinase level.  Drink lots of fluids.

## 2016-12-24 NOTE — ED Notes (Signed)
Upon entering room to insert IV, pt requested to speak to MD again about plan of care- pt had some appointments that she was unable to miss and would like to leave

## 2016-12-24 NOTE — ED Provider Notes (Signed)
[facility]-EMERGENCY DEPT Provider Note   CSN: [id number] Arrival date & time: 12/24/16  1244     History   Chief Complaint Chief Complaint  Patient presents with  . Arm Pain    HPI Ashley Jensen is a 31 y.o. female.  Pt presents to the ED today with right arm pain.  She woke up around 0300 with severe pain.  She took ibuprofen which helped a little, but it still hurts.  She points to her outer arm.  She said she has pain with movement.  She has a hx of dvt in pregnancy and is worried she has a dvt.  She does exercise and do cross fit, but did not exercise yesterday.      Past Medical History:  Diagnosis Date  . Chronic headaches   . DVT (deep vein thrombosis) in pregnancy (HCC)   . Heart murmur   . Seasonal allergies     Patient Active Problem List   Diagnosis Date Noted  . Seasonal and perennial allergic rhinitis 11/01/2014  . Conjunctivitis, allergic 11/01/2014    History reviewed. No pertinent surgical history.  OB History    No data available       Home Medications    Prior to Admission medications   Medication Sig Start Date End Date Taking? Authorizing Provider  Azelastine-Fluticasone (DYMISTA) 137-50 MCG/ACT SUSP Place 1-2 puffs into both nostrils at bedtime. 09/06/15   Waymon Budge, MD  fluticasone (FLONASE) 50 MCG/ACT nasal spray Place 1-2 sprays into both nostrils at bedtime. 07/14/15   Waymon Budge, MD  ibuprofen (ADVIL,MOTRIN) 600 MG tablet Take 1 tablet (600 mg total) by mouth every 6 (six) hours as needed. 12/24/16   Jacalyn Lefevre, MD  loratadine (ALLERGY) 10 MG tablet Take 10 mg by mouth daily.    [provider]  norethindrone-ethinyl estradiol-iron (ESTROSTEP FE,TILIA FE,TRI-LEGEST FE) 1-20/1-30/1-35 MG-MCG tablet Take 1 tablet by mouth daily.    [provider]  OVER THE COUNTER MEDICATION Take 1 tablet by mouth daily. Thermo-plus    [provider]  Probiotic Product (PROBIOTIC ADVANCED PO) Take 1 tablet by mouth  daily.    [provider]  Short Ragweed Pollen Ext (RAGWITEK) 12 AMB A 1-U SUBL Place 1 tablet under the tongue daily. 06/06/16   Waymon Budge, MD  Juliann Mule Pollen Allergen [place]) 2800 BAU SUBL Place 1 tablet under the tongue daily. through grass pollen season ending June 15 06/06/16   Waymon Budge, MD    Family History No family history on file.  Social History Social History  Substance Use Topics  . Smoking status: Never Smoker  . Smokeless tobacco: Never Used  . Alcohol use 0.6 oz/week    1 Standard drinks or equivalent per week     Allergies   Latex   Review of Systems Review of Systems  Musculoskeletal:       Right arm pain  All other systems reviewed and are negative.    Physical Exam Updated Vital Signs BP (!) 135/97 (BP Location: Right Arm)   Pulse (!) 52   Temp 98 F (36.7 C) (Oral)   Resp 20   SpO2 98%   Physical Exam  Constitutional: She is oriented to person, place, and time. She appears well-developed and well-nourished.  HENT:  Head: Normocephalic and atraumatic.  Right Ear: External ear normal.  Left Ear: External ear normal.  Nose: Nose normal.  Mouth/Throat: Oropharynx is clear and moist.  Eyes: Pupils are equal, round,  and reactive to light. Conjunctivae and EOM are normal.  Neck: Normal range of motion. Neck supple.  Cardiovascular: Normal rate, regular rhythm, normal heart sounds and intact distal pulses.   Pulmonary/Chest: Effort normal and breath sounds normal.  Abdominal: Soft. Bowel sounds are normal.  Musculoskeletal:       Arms: Neurological: She is alert and oriented to person, place, and time.  Skin: Skin is warm.  Psychiatric: She has a normal mood and affect. Her behavior is normal. Judgment and thought content normal.  Nursing note and vitals reviewed.    ED Treatments / Results  Labs (all labs ordered are listed, but only abnormal results are displayed) Labs Reviewed  BASIC METABOLIC PANEL  CBC  WITH DIFFERENTIAL/PLATELET  CK    EKG  EKG Interpretation None       Radiology No results found.  Procedures Procedures (including critical care time)  Medications Ordered in ED Medications  sodium chloride 0.9 % bolus 1,000 mL (not administered)     Initial Impression / Assessment and Plan / ED Course  I have reviewed the triage vital signs and the nursing notes.  Pertinent labs & imaging results that were available during my care of the patient were reviewed by me and considered in my medical decision making (see chart for details).  US - for DVT   I have suspicion for rhabdo.  The pt said she has to get to work and does not want IVFs or blood work.  She said she will go to her doctor tomorrow to be checked.  She knows to return here if worse.  Final Clinical Impressions(s) / ED Diagnoses   Final diagnoses:  Muscle strain of right upper arm, initial encounter    New Prescriptions New Prescriptions   IBUPROFEN (ADVIL,MOTRIN) 600 MG TABLET    Take 1 tablet (600 mg total) by mouth every 6 (six) hours as needed.     Jacalyn LefevreHaviland, Yeshua Stryker, MD 12/24/16 1430

## 2016-12-24 NOTE — Progress Notes (Signed)
VASCULAR LAB PRELIMINARY  PRELIMINARY  PRELIMINARY  PRELIMINARY  Right upper extremity venous duplex completed.    Preliminary report:  There is no DVT or SVT noted in the right upper extremity.  Gave report to Dr. Orson EvaJacubowitz  Haruko Mersch, [facility]AND[facility], RVT 12/24/2016, 2:08 PM

## 2016-12-24 NOTE — ED Triage Notes (Signed)
Woke up this am with rt arm pain , denies any injury , put ice on it and took Motrin but that help only a little , hurts mostly in arm pit  And radiates down arm  Has good pulse and can wiggle  Fingers   And has good neuro has hx of dvt in  leg

## 2016-12-25 DIAGNOSIS — M791 Myalgia: Secondary | ICD-10-CM | POA: Diagnosis not present

## 2016-12-31 DIAGNOSIS — M7521 Bicipital tendinitis, right shoulder: Secondary | ICD-10-CM | POA: Diagnosis not present

## 2017-03-15 DIAGNOSIS — M9901 Segmental and somatic dysfunction of cervical region: Secondary | ICD-10-CM | POA: Diagnosis not present

## 2017-03-15 DIAGNOSIS — M9902 Segmental and somatic dysfunction of thoracic region: Secondary | ICD-10-CM | POA: Diagnosis not present

## 2017-03-15 DIAGNOSIS — R51 Headache: Secondary | ICD-10-CM | POA: Diagnosis not present

## 2017-03-15 DIAGNOSIS — M791 Myalgia, unspecified site: Secondary | ICD-10-CM | POA: Diagnosis not present

## 2017-04-10 DIAGNOSIS — J029 Acute pharyngitis, unspecified: Secondary | ICD-10-CM | POA: Diagnosis not present

## 2017-04-10 DIAGNOSIS — H6123 Impacted cerumen, bilateral: Secondary | ICD-10-CM | POA: Diagnosis not present

## 2017-04-10 DIAGNOSIS — H6501 Acute serous otitis media, right ear: Secondary | ICD-10-CM | POA: Diagnosis not present

## 2017-04-10 DIAGNOSIS — R05 Cough: Secondary | ICD-10-CM | POA: Diagnosis not present

## 2017-04-16 DIAGNOSIS — M9902 Segmental and somatic dysfunction of thoracic region: Secondary | ICD-10-CM | POA: Diagnosis not present

## 2017-04-16 DIAGNOSIS — M9901 Segmental and somatic dysfunction of cervical region: Secondary | ICD-10-CM | POA: Diagnosis not present

## 2017-04-16 DIAGNOSIS — M791 Myalgia, unspecified site: Secondary | ICD-10-CM | POA: Diagnosis not present

## 2017-04-16 DIAGNOSIS — R51 Headache: Secondary | ICD-10-CM | POA: Diagnosis not present

## 2017-04-18 DIAGNOSIS — R51 Headache: Secondary | ICD-10-CM | POA: Diagnosis not present

## 2017-04-18 DIAGNOSIS — M791 Myalgia, unspecified site: Secondary | ICD-10-CM | POA: Diagnosis not present

## 2017-04-18 DIAGNOSIS — M9902 Segmental and somatic dysfunction of thoracic region: Secondary | ICD-10-CM | POA: Diagnosis not present

## 2017-04-18 DIAGNOSIS — M9901 Segmental and somatic dysfunction of cervical region: Secondary | ICD-10-CM | POA: Diagnosis not present

## 2017-05-11 DIAGNOSIS — R05 Cough: Secondary | ICD-10-CM | POA: Diagnosis not present

## 2017-05-11 DIAGNOSIS — T753XXA Motion sickness, initial encounter: Secondary | ICD-10-CM | POA: Diagnosis not present

## 2017-05-14 DIAGNOSIS — M791 Myalgia, unspecified site: Secondary | ICD-10-CM | POA: Diagnosis not present

## 2017-05-14 DIAGNOSIS — M9901 Segmental and somatic dysfunction of cervical region: Secondary | ICD-10-CM | POA: Diagnosis not present

## 2017-05-14 DIAGNOSIS — R51 Headache: Secondary | ICD-10-CM | POA: Diagnosis not present

## 2017-05-14 DIAGNOSIS — M9902 Segmental and somatic dysfunction of thoracic region: Secondary | ICD-10-CM | POA: Diagnosis not present

## 2017-05-31 DIAGNOSIS — Z01419 Encounter for gynecological examination (general) (routine) without abnormal findings: Secondary | ICD-10-CM | POA: Diagnosis not present

## 2017-05-31 DIAGNOSIS — Z6826 Body mass index (BMI) 26.0-26.9, adult: Secondary | ICD-10-CM | POA: Diagnosis not present

## 2017-06-30 DIAGNOSIS — J018 Other acute sinusitis: Secondary | ICD-10-CM | POA: Diagnosis not present

## 2017-07-17 DIAGNOSIS — R05 Cough: Secondary | ICD-10-CM | POA: Diagnosis not present

## 2017-07-17 DIAGNOSIS — J309 Allergic rhinitis, unspecified: Secondary | ICD-10-CM | POA: Diagnosis not present

## 2017-09-07 DIAGNOSIS — R51 Headache: Secondary | ICD-10-CM | POA: Diagnosis not present

## 2017-09-07 DIAGNOSIS — M9902 Segmental and somatic dysfunction of thoracic region: Secondary | ICD-10-CM | POA: Diagnosis not present

## 2017-09-07 DIAGNOSIS — M791 Myalgia, unspecified site: Secondary | ICD-10-CM | POA: Diagnosis not present

## 2017-09-07 DIAGNOSIS — M9901 Segmental and somatic dysfunction of cervical region: Secondary | ICD-10-CM | POA: Diagnosis not present

## 2017-10-24 DIAGNOSIS — M9905 Segmental and somatic dysfunction of pelvic region: Secondary | ICD-10-CM | POA: Diagnosis not present

## 2017-10-24 DIAGNOSIS — M9901 Segmental and somatic dysfunction of cervical region: Secondary | ICD-10-CM | POA: Diagnosis not present

## 2017-10-24 DIAGNOSIS — M5386 Other specified dorsopathies, lumbar region: Secondary | ICD-10-CM | POA: Diagnosis not present

## 2017-10-24 DIAGNOSIS — M9903 Segmental and somatic dysfunction of lumbar region: Secondary | ICD-10-CM | POA: Diagnosis not present

## 2017-11-16 DIAGNOSIS — S2341XA Sprain of ribs, initial encounter: Secondary | ICD-10-CM | POA: Diagnosis not present

## 2017-11-16 DIAGNOSIS — J329 Chronic sinusitis, unspecified: Secondary | ICD-10-CM | POA: Diagnosis not present

## 2018-02-06 DIAGNOSIS — M9901 Segmental and somatic dysfunction of cervical region: Secondary | ICD-10-CM | POA: Diagnosis not present

## 2018-02-06 DIAGNOSIS — M9903 Segmental and somatic dysfunction of lumbar region: Secondary | ICD-10-CM | POA: Diagnosis not present

## 2018-02-06 DIAGNOSIS — M5386 Other specified dorsopathies, lumbar region: Secondary | ICD-10-CM | POA: Diagnosis not present

## 2018-02-06 DIAGNOSIS — M9905 Segmental and somatic dysfunction of pelvic region: Secondary | ICD-10-CM | POA: Diagnosis not present

## 2018-02-13 DIAGNOSIS — M9905 Segmental and somatic dysfunction of pelvic region: Secondary | ICD-10-CM | POA: Diagnosis not present

## 2018-02-13 DIAGNOSIS — M9901 Segmental and somatic dysfunction of cervical region: Secondary | ICD-10-CM | POA: Diagnosis not present

## 2018-02-13 DIAGNOSIS — M5386 Other specified dorsopathies, lumbar region: Secondary | ICD-10-CM | POA: Diagnosis not present

## 2018-02-13 DIAGNOSIS — M9903 Segmental and somatic dysfunction of lumbar region: Secondary | ICD-10-CM | POA: Diagnosis not present

## 2018-03-06 DIAGNOSIS — M5386 Other specified dorsopathies, lumbar region: Secondary | ICD-10-CM | POA: Diagnosis not present

## 2018-03-06 DIAGNOSIS — M9901 Segmental and somatic dysfunction of cervical region: Secondary | ICD-10-CM | POA: Diagnosis not present

## 2018-03-06 DIAGNOSIS — M9903 Segmental and somatic dysfunction of lumbar region: Secondary | ICD-10-CM | POA: Diagnosis not present

## 2018-03-06 DIAGNOSIS — M9905 Segmental and somatic dysfunction of pelvic region: Secondary | ICD-10-CM | POA: Diagnosis not present

## 2018-04-03 DIAGNOSIS — M9905 Segmental and somatic dysfunction of pelvic region: Secondary | ICD-10-CM | POA: Diagnosis not present

## 2018-04-03 DIAGNOSIS — M5386 Other specified dorsopathies, lumbar region: Secondary | ICD-10-CM | POA: Diagnosis not present

## 2018-04-03 DIAGNOSIS — M9903 Segmental and somatic dysfunction of lumbar region: Secondary | ICD-10-CM | POA: Diagnosis not present

## 2018-04-03 DIAGNOSIS — M9901 Segmental and somatic dysfunction of cervical region: Secondary | ICD-10-CM | POA: Diagnosis not present

## 2018-04-20 DIAGNOSIS — R05 Cough: Secondary | ICD-10-CM | POA: Diagnosis not present

## 2018-04-20 DIAGNOSIS — J0100 Acute maxillary sinusitis, unspecified: Secondary | ICD-10-CM | POA: Diagnosis not present

## 2018-04-22 DIAGNOSIS — Z3149 Encounter for other procreative investigation and testing: Secondary | ICD-10-CM | POA: Diagnosis not present

## 2018-04-22 DIAGNOSIS — Z3169 Encounter for other general counseling and advice on procreation: Secondary | ICD-10-CM | POA: Diagnosis not present

## 2018-04-22 DIAGNOSIS — Z31430 Encounter of female for testing for genetic disease carrier status for procreative management: Secondary | ICD-10-CM | POA: Diagnosis not present

## 2018-04-26 DIAGNOSIS — T753XXA Motion sickness, initial encounter: Secondary | ICD-10-CM | POA: Diagnosis not present

## 2018-04-26 DIAGNOSIS — F40243 Fear of flying: Secondary | ICD-10-CM | POA: Diagnosis not present

## 2018-05-03 DIAGNOSIS — M9903 Segmental and somatic dysfunction of lumbar region: Secondary | ICD-10-CM | POA: Diagnosis not present

## 2018-05-03 DIAGNOSIS — M9905 Segmental and somatic dysfunction of pelvic region: Secondary | ICD-10-CM | POA: Diagnosis not present

## 2018-05-03 DIAGNOSIS — M9901 Segmental and somatic dysfunction of cervical region: Secondary | ICD-10-CM | POA: Diagnosis not present

## 2018-05-03 DIAGNOSIS — M5386 Other specified dorsopathies, lumbar region: Secondary | ICD-10-CM | POA: Diagnosis not present

## 2018-06-10 DIAGNOSIS — Z3040 Encounter for surveillance of contraceptives, unspecified: Secondary | ICD-10-CM | POA: Diagnosis not present

## 2018-06-10 DIAGNOSIS — Z6829 Body mass index (BMI) 29.0-29.9, adult: Secondary | ICD-10-CM | POA: Diagnosis not present

## 2018-06-10 DIAGNOSIS — Z01419 Encounter for gynecological examination (general) (routine) without abnormal findings: Secondary | ICD-10-CM | POA: Diagnosis not present

## 2018-06-27 DIAGNOSIS — M9903 Segmental and somatic dysfunction of lumbar region: Secondary | ICD-10-CM | POA: Diagnosis not present

## 2018-06-27 DIAGNOSIS — M5386 Other specified dorsopathies, lumbar region: Secondary | ICD-10-CM | POA: Diagnosis not present

## 2018-06-27 DIAGNOSIS — M9905 Segmental and somatic dysfunction of pelvic region: Secondary | ICD-10-CM | POA: Diagnosis not present

## 2018-06-27 DIAGNOSIS — M9901 Segmental and somatic dysfunction of cervical region: Secondary | ICD-10-CM | POA: Diagnosis not present

## 2018-07-29 DIAGNOSIS — M5386 Other specified dorsopathies, lumbar region: Secondary | ICD-10-CM | POA: Diagnosis not present

## 2018-07-29 DIAGNOSIS — M9905 Segmental and somatic dysfunction of pelvic region: Secondary | ICD-10-CM | POA: Diagnosis not present

## 2018-07-29 DIAGNOSIS — M9901 Segmental and somatic dysfunction of cervical region: Secondary | ICD-10-CM | POA: Diagnosis not present

## 2018-07-29 DIAGNOSIS — M9903 Segmental and somatic dysfunction of lumbar region: Secondary | ICD-10-CM | POA: Diagnosis not present

## 2018-10-21 DIAGNOSIS — M9903 Segmental and somatic dysfunction of lumbar region: Secondary | ICD-10-CM | POA: Diagnosis not present

## 2018-10-21 DIAGNOSIS — M9905 Segmental and somatic dysfunction of pelvic region: Secondary | ICD-10-CM | POA: Diagnosis not present

## 2018-10-21 DIAGNOSIS — M5386 Other specified dorsopathies, lumbar region: Secondary | ICD-10-CM | POA: Diagnosis not present

## 2018-10-21 DIAGNOSIS — M9901 Segmental and somatic dysfunction of cervical region: Secondary | ICD-10-CM | POA: Diagnosis not present

## 2018-10-24 DIAGNOSIS — M9901 Segmental and somatic dysfunction of cervical region: Secondary | ICD-10-CM | POA: Diagnosis not present

## 2018-10-24 DIAGNOSIS — M9905 Segmental and somatic dysfunction of pelvic region: Secondary | ICD-10-CM | POA: Diagnosis not present

## 2018-10-24 DIAGNOSIS — M5386 Other specified dorsopathies, lumbar region: Secondary | ICD-10-CM | POA: Diagnosis not present

## 2018-10-24 DIAGNOSIS — M9903 Segmental and somatic dysfunction of lumbar region: Secondary | ICD-10-CM | POA: Diagnosis not present

## 2018-12-30 DIAGNOSIS — N911 Secondary amenorrhea: Secondary | ICD-10-CM | POA: Diagnosis not present

## 2019-01-02 DIAGNOSIS — Z3401 Encounter for supervision of normal first pregnancy, first trimester: Secondary | ICD-10-CM | POA: Diagnosis not present

## 2019-01-02 DIAGNOSIS — Z3481 Encounter for supervision of other normal pregnancy, first trimester: Secondary | ICD-10-CM | POA: Diagnosis not present

## 2019-01-02 DIAGNOSIS — Z3685 Encounter for antenatal screening for Streptococcus B: Secondary | ICD-10-CM | POA: Diagnosis not present

## 2019-01-15 DIAGNOSIS — M9901 Segmental and somatic dysfunction of cervical region: Secondary | ICD-10-CM | POA: Diagnosis not present

## 2019-01-15 DIAGNOSIS — M9903 Segmental and somatic dysfunction of lumbar region: Secondary | ICD-10-CM | POA: Diagnosis not present

## 2019-01-15 DIAGNOSIS — M9905 Segmental and somatic dysfunction of pelvic region: Secondary | ICD-10-CM | POA: Diagnosis not present

## 2019-01-15 DIAGNOSIS — M5386 Other specified dorsopathies, lumbar region: Secondary | ICD-10-CM | POA: Diagnosis not present

## 2019-01-16 DIAGNOSIS — Z20828 Contact with and (suspected) exposure to other viral communicable diseases: Secondary | ICD-10-CM | POA: Diagnosis not present

## 2019-01-19 DIAGNOSIS — Z20828 Contact with and (suspected) exposure to other viral communicable diseases: Secondary | ICD-10-CM | POA: Diagnosis not present

## 2019-01-21 DIAGNOSIS — O021 Missed abortion: Secondary | ICD-10-CM | POA: Diagnosis not present

## 2019-01-21 DIAGNOSIS — Z113 Encounter for screening for infections with a predominantly sexual mode of transmission: Secondary | ICD-10-CM | POA: Diagnosis not present

## 2019-01-21 DIAGNOSIS — R1011 Right upper quadrant pain: Secondary | ICD-10-CM | POA: Diagnosis not present

## 2019-01-22 ENCOUNTER — Other Ambulatory Visit: Payer: Self-pay | Admitting: Obstetrics and Gynecology

## 2019-01-22 ENCOUNTER — Other Ambulatory Visit (HOSPITAL_COMMUNITY): Payer: Self-pay | Admitting: Obstetrics and Gynecology

## 2019-01-22 DIAGNOSIS — O021 Missed abortion: Secondary | ICD-10-CM

## 2019-01-23 ENCOUNTER — Other Ambulatory Visit: Payer: Self-pay

## 2019-01-23 ENCOUNTER — Other Ambulatory Visit (HOSPITAL_COMMUNITY)
Admission: RE | Admit: 2019-01-23 | Discharge: 2019-01-23 | Disposition: A | Discharge status: Home / Self Care | Payer: BC Managed Care – PPO | Source: Ambulatory Visit | Attending: Obstetrics and Gynecology | Admitting: Obstetrics and Gynecology

## 2019-01-23 ENCOUNTER — Encounter (HOSPITAL_COMMUNITY): Payer: Self-pay | Admitting: *Deleted

## 2019-01-23 DIAGNOSIS — Z20828 Contact with and (suspected) exposure to other viral communicable diseases: Secondary | ICD-10-CM | POA: Insufficient documentation

## 2019-01-23 DIAGNOSIS — Z01812 Encounter for preprocedural laboratory examination: Secondary | ICD-10-CM | POA: Insufficient documentation

## 2019-01-23 LAB — SARS CORONAVIRUS 2 (TAT 6-24 HRS): SARS Coronavirus 2: NEGATIVE

## 2019-01-23 NOTE — H&P (Signed)
Ashley Jensen is an 33 y.o. female. Presenting for MAB. She had an uncomplicated pregnancy with normal viability scan. She came in for [place] at 10 weeks and fetus measured 10.5 wga without any heart beat. She has a hx of a heart murmur and anxiety, not on any medications. Works as Government social research officer at [employer].   Pertinent Gynecological History: Menses: flow is moderate  Pap NIL 2019 OB History: G2, P0020 - TAB x 1. MAB x1 (current)   Menstrual History: No LMP recorded.    Past Medical History:  Diagnosis Date  . Chronic headaches   . DVT (deep vein thrombosis) in pregnancy ([place])   . Heart murmur   . Seasonal allergies     No past surgical history on file.  Varicose vein surgery  No family history on file.  Social History:  reports that she has never smoked. She has never used smokeless tobacco. She reports current alcohol use of about 1.0 standard drinks of alcohol per week. No history on file for drug.  Allergies:  Allergies  Allergen Reactions  . Latex Itching    No medications prior to admission.    ROS  There were no vitals taken for this visit. Physical Exam  Gen: well appearing, NAD CV: Reg rate Pulm: NWOB Abd: soft, nondistended, nontender, no mass es GYN: uterus 10 week size, no adnexa ttp/CMT Ext: No edema b/l   No results found for this or any previous visit (from the past 24 hour(s)).  No results found.  Assessment/Plan: 33 yo G2P0 with MAB at 10.3 wga. Pt counseled on TVUS results and diagnosis of MAB. Discussed that it is unlikely to be her fault nor could she have prevented it. Reviewed that this miscarriage does not likely reflect her ability to have a successful pregnancy in the future, and that miscarriage is common - 1:5 pregnancies. She was counseled on options for managing missed ab including expectant, medical, and surgical management.  Patient desires definitive surgical management with suction dilation and curettage. Will do chromosomal analysis  and US guidance.   Risks/benefits/ and alternatives reviewed with patient with risks including but not limited to bleeding, infection, uterine perforation, and damage to nearby structures such as the bowel, bladder, vessels, and/or other organs. She was given opportunity to ask questions and all questions answered. Patient consents to proceed with suction dilation and curettage. Reviewed bleeding precautions. Her blood type is O+ and she does not need Rhogam. Given instructions and she verbalizes understanding. Discussed the importance of having at least one normal periods after completion of the process, before attempting conception again. Discussed S/S to call back for. All questions answered and pt verbalizes understanding w/out further questions/concerns. Risks discussed including infection, bleeding, damage to surrounding structures, need for additional procedures, postoperative DVT and subsequent scarring. All questions answered. Consent signed in office.  Plan on doxycycline postop and consider cytotec x 3 days.    Tyson Dense 01/23/2019, 4:40 PM

## 2019-01-23 NOTE — Progress Notes (Signed)
Left message for patient to return call about missed COVId test today, gave instructions of drive thru hours today

## 2019-01-23 NOTE — Progress Notes (Signed)
Spoke with pt for pre-op call. Pt has hx of a heart murmur since birth, states it's never given her any problems. Pt denies being diabetic.  Pt had Covid test done today, states she is in quarantine.  Pt will come to hospital this afternoon to pick up a Pre-Surgery Ensure drink.   Coronavirus Screening  Have you experienced the following symptoms:  Cough yes/no: No Fever (>100.91F)  yes/no: No Runny nose yes/no: No Sore throat yes/no: No Difficulty breathing/shortness of breath  yes/no: No  Have you or a family member traveled in the last 14 days and where? yes/no: No   Pt is aware that she may have one visitor to wait in the waiting area while she is in pre-op, surgery and PACU.

## 2019-01-24 ENCOUNTER — Encounter (HOSPITAL_COMMUNITY): Payer: Self-pay

## 2019-01-24 ENCOUNTER — Ambulatory Visit (HOSPITAL_COMMUNITY): Payer: BC Managed Care – PPO | Admitting: Certified Registered Nurse Anesthetist

## 2019-01-24 ENCOUNTER — Ambulatory Visit (HOSPITAL_COMMUNITY)
Admission: RE | Admit: 2019-01-24 | Discharge: 2019-01-24 | Disposition: A | Discharge status: Home / Self Care | Payer: BC Managed Care – PPO | Source: Ambulatory Visit | Attending: Obstetrics and Gynecology | Admitting: Obstetrics and Gynecology

## 2019-01-24 ENCOUNTER — Ambulatory Visit (HOSPITAL_COMMUNITY)
Admission: RE | Admit: 2019-01-24 | Discharge: 2019-01-24 | Disposition: A | Discharge status: Home / Self Care | Payer: BC Managed Care – PPO | Attending: Obstetrics and Gynecology | Admitting: Obstetrics and Gynecology

## 2019-01-24 ENCOUNTER — Encounter (HOSPITAL_COMMUNITY)
Admission: RE | Disposition: A | Discharge status: Home / Self Care | Payer: Self-pay | Source: Home / Self Care | Attending: Obstetrics and Gynecology

## 2019-01-24 DIAGNOSIS — O021 Missed abortion: Secondary | ICD-10-CM

## 2019-01-24 DIAGNOSIS — R011 Cardiac murmur, unspecified: Secondary | ICD-10-CM | POA: Diagnosis not present

## 2019-01-24 DIAGNOSIS — Z3A10 10 weeks gestation of pregnancy: Secondary | ICD-10-CM | POA: Diagnosis not present

## 2019-01-24 DIAGNOSIS — R51 Headache: Secondary | ICD-10-CM | POA: Diagnosis not present

## 2019-01-24 DIAGNOSIS — F419 Anxiety disorder, unspecified: Secondary | ICD-10-CM | POA: Diagnosis not present

## 2019-01-24 DIAGNOSIS — Z3490 Encounter for supervision of normal pregnancy, unspecified, unspecified trimester: Secondary | ICD-10-CM

## 2019-01-24 DIAGNOSIS — Z86718 Personal history of other venous thrombosis and embolism: Secondary | ICD-10-CM | POA: Insufficient documentation

## 2019-01-24 DIAGNOSIS — O99341 Other mental disorders complicating pregnancy, first trimester: Secondary | ICD-10-CM | POA: Diagnosis not present

## 2019-01-24 DIAGNOSIS — Z91040 Latex allergy status: Secondary | ICD-10-CM | POA: Insufficient documentation

## 2019-01-24 HISTORY — DX: Anemia, unspecified: D64.9

## 2019-01-24 HISTORY — DX: Pneumonia, unspecified organism: J18.9

## 2019-01-24 HISTORY — DX: Gastro-esophageal reflux disease without esophagitis: K21.9

## 2019-01-24 HISTORY — PX: DILATION AND EVACUATION: SHX1459

## 2019-01-24 HISTORY — PX: OPERATIVE ULTRASOUND: SHX5996

## 2019-01-24 HISTORY — DX: Anxiety disorder, unspecified: F41.9

## 2019-01-24 LAB — BASIC METABOLIC PANEL
Anion gap: 11 (ref 5–15)
BUN: 6 mg/dL (ref 6–20)
CO2: 15 mmol/L — ABNORMAL LOW (ref 22–32)
Calcium: 8.9 mg/dL (ref 8.9–10.3)
Chloride: 107 mmol/L (ref 98–111)
Creatinine, Ser: 0.64 mg/dL (ref 0.44–1.00)
GFR calc Af Amer: >60 mL/min (ref >60)
GFR calc non Af Amer: >60 mL/min (ref >60)
Glucose, Bld: 103 mg/dL — ABNORMAL HIGH (ref 70–99)
Potassium: 3.8 mmol/L (ref 3.5–5.1)
Sodium: 133 mmol/L — ABNORMAL LOW (ref 135–145)

## 2019-01-24 LAB — CBC
HCT: 37.9 % (ref 36.0–46.0)
Hemoglobin: 13.1 g/dL (ref 12.0–15.0)
MCH: 29.4 pg (ref 26.0–34.0)
MCHC: 34.6 g/dL (ref 30.0–36.0)
MCV: 85.2 fL (ref 80.0–100.0)
Platelets: 278 10*3/uL (ref 150–400)
RBC: 4.45 MIL/uL (ref 3.87–5.11)
RDW: 13 % (ref 11.5–15.5)
WBC: 8.7 10*3/uL (ref 4.0–10.5)
nRBC: 0 % (ref 0.0–0.2)

## 2019-01-24 LAB — ABO/RH: ABO/RH(D): O POS

## 2019-01-24 LAB — TYPE AND SCREEN
ABO/RH(D): O POS
Antibody Screen: NEGATIVE

## 2019-01-24 SURGERY — DILATION AND EVACUATION, UTERUS
Anesthesia: General

## 2019-01-24 MED ORDER — FENTANYL CITRATE (PF) 250 MCG/5ML IJ SOLN
INTRAMUSCULAR | Status: AC
  Filled 2019-01-24: qty 5

## 2019-01-24 MED ORDER — KETOROLAC TROMETHAMINE 30 MG/ML IJ SOLN
INTRAMUSCULAR | Status: DC | PRN
  Administered 2019-01-24: 30 mg via INTRAVENOUS

## 2019-01-24 MED ORDER — MIDAZOLAM HCL 2 MG/2ML IJ SOLN
INTRAMUSCULAR | Status: AC
  Filled 2019-01-24: qty 2

## 2019-01-24 MED ORDER — PROPOFOL 10 MG/ML IV BOLUS
INTRAVENOUS | Status: AC
  Filled 2019-01-24: qty 20

## 2019-01-24 MED ORDER — FENTANYL CITRATE (PF) 250 MCG/5ML IJ SOLN
INTRAMUSCULAR | Status: DC | PRN
  Administered 2019-01-24: 50 ug via INTRAVENOUS

## 2019-01-24 MED ORDER — ACETAMINOPHEN 10 MG/ML IV SOLN: 1000.0000 mg | Freq: Once | INTRAVENOUS | Status: DC | PRN

## 2019-01-24 MED ORDER — CHLOROPROCAINE HCL 1 % IJ SOLN
INTRAMUSCULAR | Status: DC | PRN
  Administered 2019-01-24: 10 mL

## 2019-01-24 MED ORDER — LACTATED RINGERS IV SOLN
INTRAVENOUS | Status: DC
  Administered 2019-01-24: 10:00:00 via INTRAVENOUS

## 2019-01-24 MED ORDER — DEXAMETHASONE SODIUM PHOSPHATE 10 MG/ML IJ SOLN
INTRAMUSCULAR | Status: DC | PRN
  Administered 2019-01-24: 10 mg via INTRAVENOUS

## 2019-01-24 MED ORDER — OXYCODONE HCL 5 MG PO TABS: 5.0000 mg | ORAL_TABLET | Freq: Once | ORAL | Status: DC | PRN

## 2019-01-24 MED ORDER — DOXYCYCLINE MONOHYDRATE 100 MG PO TABS: 100.0000 mg | ORAL_TABLET | Freq: Two times a day (BID) | ORAL | 0 refills | Status: AC

## 2019-01-24 MED ORDER — ONDANSETRON HCL 4 MG/2ML IJ SOLN
INTRAMUSCULAR | Status: DC | PRN
  Administered 2019-01-24: 4 mg via INTRAVENOUS

## 2019-01-24 MED ORDER — ACETAMINOPHEN 500 MG PO TABS: 1000.0000 mg | ORAL_TABLET | Freq: Once | ORAL | Status: DC | PRN

## 2019-01-24 MED ORDER — ACETAMINOPHEN 160 MG/5ML PO SOLN: 1000.0000 mg | Freq: Once | ORAL | Status: DC | PRN

## 2019-01-24 MED ORDER — CHLOROPROCAINE HCL 1 % IJ SOLN
INTRAMUSCULAR | Status: AC
  Filled 2019-01-24: qty 30

## 2019-01-24 MED ORDER — PROPOFOL 10 MG/ML IV BOLUS
INTRAVENOUS | Status: DC | PRN
  Administered 2019-01-24: 20 mg via INTRAVENOUS
  Administered 2019-01-24: 100 mg via INTRAVENOUS
  Administered 2019-01-24: 180 mg via INTRAVENOUS
  Administered 2019-01-24: 50 mg via INTRAVENOUS

## 2019-01-24 MED ORDER — FENTANYL CITRATE (PF) 100 MCG/2ML IJ SOLN: 25.0000 ug | INTRAMUSCULAR | Status: DC | PRN

## 2019-01-24 MED ORDER — OXYCODONE HCL 5 MG/5ML PO SOLN: 5.0000 mg | Freq: Once | ORAL | Status: DC | PRN

## 2019-01-24 MED ORDER — LIDOCAINE 2% (20 MG/ML) 5 ML SYRINGE
INTRAMUSCULAR | Status: DC | PRN
  Administered 2019-01-24: 60 mg via INTRAVENOUS

## 2019-01-24 MED ORDER — MIDAZOLAM HCL 2 MG/2ML IJ SOLN
INTRAMUSCULAR | Status: DC | PRN
  Administered 2019-01-24: 2 mg via INTRAVENOUS
  Administered 2019-01-24 (×2): 25 mg via INTRAVENOUS

## 2019-01-24 MED ORDER — MISOPROSTOL 200 MCG PO TABS: 200.0000 ug | ORAL_TABLET | Freq: Two times a day (BID) | ORAL | 0 refills | Status: DC

## 2019-01-24 SURGICAL SUPPLY — 19 items
CATH ROBINSON RED A/P 16FR (CATHETERS) ×2 IMPLANT
DECANTER SPIKE VIAL GLASS SM (MISCELLANEOUS) ×2 IMPLANT
GLOVE BIO SURGEON STRL SZ 6.5 (GLOVE) ×2 IMPLANT
GLOVE BIOGEL PI IND STRL 7.0 (GLOVE) ×2 IMPLANT
GLOVE BIOGEL PI INDICATOR 7.0 (GLOVE) ×2
GOWN STRL REUS W/ TWL LRG LVL3 (GOWN DISPOSABLE) ×2 IMPLANT
GOWN STRL REUS W/TWL LRG LVL3 (GOWN DISPOSABLE) ×2
HOSE CONNECTING 18IN BERKELEY (TUBING) ×2 IMPLANT
KIT BERKELEY 1ST TRIMESTER 3/8 (MISCELLANEOUS) ×4 IMPLANT
NS IRRIG 1000ML POUR BTL (IV SOLUTION) ×2 IMPLANT
PACK VAGINAL MINOR WOMEN LF (CUSTOM PROCEDURE TRAY) ×2 IMPLANT
PAD OB MATERNITY 4.3X12.25 (PERSONAL CARE ITEMS) ×2 IMPLANT
SET BERKELEY SUCTION TUBING (SUCTIONS) ×2 IMPLANT
TOWEL GREEN STERILE FF (TOWEL DISPOSABLE) ×4 IMPLANT
UNDERPAD 30X30 (UNDERPADS AND DIAPERS) ×2 IMPLANT
VACURETTE 10 RIGID CVD (CANNULA) IMPLANT
VACURETTE 7MM CVD STRL WRAP (CANNULA) IMPLANT
VACURETTE 8 RIGID CVD (CANNULA) IMPLANT
VACURETTE 9 RIGID CVD (CANNULA) IMPLANT

## 2019-01-24 NOTE — Op Note (Signed)
PREOPERATIVE DIAGNOSES: 1. Missed Abortion  POSTOPERATIVE DIAGNOSES: Same  PROCEDURE PERFORMED: Dilation, suction, sharp curretage, chromosomal analysis, US guidance  SURGEON: Dr. Lucillie Garfinkel  ANESTHESIA: Paracervical block and IV sedation  ESTIMATED BLOOD LOSS: 20cc.  COMPLICATIONS: None  TUBES: None.  DRAINS: None  PATHOLOGY: Endometrial curettings for chromosomal analysis  FINDINGS: On exam, under anesthesia, normal appearing vulva and vagina, 10 week sized uterus  Operative findings demonstrated plethora of POCs  Procedure: FHT were confirmed to be absent. Hydropic fetus noted. The patient was taken to the operating room where she was properly prepped and draped in sterile manner under general anesthesia. After bimanual examination, the cervix was exposed with a speculum and the anterior lip of the cervix grasped with a tenaculum. Paracervical block performed. The endocervical canal was then progressively dilated to 55mm under US guidance. Suction catheter was introduced into the uterus and to the uterine fundus. The uterus was evacuated and good tissue return was noted all under US guidance. A sharp curettage was then performed until gritty texture noted. No perforation and injury noted under US guidance. US showed empty endometrial cavity. All instruments were removed from vagina. The sponge and lap counts were correct times 2 at this time. The patient's procedure was terminated. We then awakened her. She was sent to the Recovery Room in good condition.    Lucillie Garfinkel MD

## 2019-01-24 NOTE — Progress Notes (Signed)
Completed chromosome testing kit and blood draw and saliva collection. Placed kit in Adult nurse. Pt given instructions on pickup from [employer]. No questions at this time.

## 2019-01-24 NOTE — Progress Notes (Addendum)
No updates to above H&P. Patient arrived NPO and was consented in PACU. Risks again discussed, all questions answered, and consent signed. Proceed with above surgery. FHT absent.   Cristella Stiver MD  

## 2019-01-24 NOTE — Anesthesia Preprocedure Evaluation (Signed)
Anesthesia Evaluation  Patient identified by MRN, date of birth, ID band Patient awake    Reviewed: Allergy & Precautions, NPO status , Patient's Chart, lab work & pertinent test results  History of Anesthesia Complications Negative for: history of anesthetic complications  Airway Mallampati: II  TM Distance: >3 FB Neck ROM: Full    Dental  (+) Dental Advisory Given   Pulmonary neg pulmonary ROS, Patient abstained from smoking.,    breath sounds clear to auscultation       Cardiovascular negative cardio ROS   Rhythm:Regular     Neuro/Psych  Headaches, PSYCHIATRIC DISORDERS Anxiety    GI/Hepatic Neg liver ROS, GERD  Medicated,  Endo/Other  negative endocrine ROS  Renal/GU negative Renal ROS     Musculoskeletal   Abdominal   Peds  Hematology negative hematology ROS (+)   Anesthesia Other Findings   Reproductive/Obstetrics Missed aborton                             Anesthesia Physical Anesthesia Plan  ASA: II  Anesthesia Plan: General   Post-op Pain Management:    Induction: Intravenous  PONV Risk Score and Plan: 3 and Ondansetron and Dexamethasone  Airway Management Planned: LMA and Oral ETT  Additional Equipment: None  Intra-op Plan:   Post-operative Plan: Extubation in OR  Informed Consent: I have reviewed the patients History and Physical, chart, labs and discussed the procedure including the risks, benefits and alternatives for the proposed anesthesia with the patient or authorized representative who has indicated his/her understanding and acceptance.     Dental advisory given  Plan Discussed with: CRNA and Surgeon  Anesthesia Plan Comments:         Anesthesia Quick Evaluation

## 2019-01-24 NOTE — Anesthesia Procedure Notes (Signed)
Procedure Name: LMA Insertion Date/Time: 01/24/2019 12:14 PM Performed by: Clearnce Sorrel, CRNA Pre-anesthesia Checklist: Patient identified, Emergency Drugs available, Suction available, Patient being monitored and Timeout performed Patient Re-evaluated:Patient Re-evaluated prior to induction Oxygen Delivery Method: Circle system utilized Preoxygenation: Pre-oxygenation with 100% oxygen Induction Type: IV induction LMA: LMA inserted LMA Size: 3.0 Number of attempts: 1 Placement Confirmation: positive ETCO2 and breath sounds checked- equal and bilateral Tube secured with: Tape Dental Injury: Teeth and Oropharynx as per pre-operative assessment

## 2019-01-24 NOTE — Transfer of Care (Signed)
Immediate Anesthesia Transfer of Care Note  Patient: Ashley Jensen  Procedure(s) Performed: DILATATION AND EVACUATION, CHROMOSONE ANALYSIS (N/A ) OPERATIVE ULTRASOUND (N/A )  Patient Location: PACU  Anesthesia Type:General  Level of Consciousness: awake, alert  and oriented  Airway & Oxygen Therapy: Patient Spontanous Breathing  Post-op Assessment: Report given to RN and Post -op Vital signs reviewed and stable  Post vital signs: Reviewed and stable  Last Vitals:  Vitals Value Taken Time  BP 124/77 01/24/19 1244  Temp    Pulse 64 01/24/19 1245  Resp 23 01/24/19 1245  SpO2 99 % 01/24/19 1245  Vitals shown include unvalidated device data.  Last Pain:  Vitals:   01/24/19 1018  TempSrc:   PainSc: 0-No pain      Patients Stated Pain Goal: 4 (97/94/80 1655)  Complications: No apparent anesthesia complications

## 2019-01-26 NOTE — Anesthesia Postprocedure Evaluation (Signed)
Anesthesia Post Note  Patient: Mahoganie Ryback  Procedure(s) Performed: DILATATION AND EVACUATION, CHROMOSONE ANALYSIS (N/A ) OPERATIVE ULTRASOUND (N/A )     Patient location during evaluation: PACU Anesthesia Type: General Level of consciousness: awake and alert Pain management: pain level controlled Vital Signs Assessment: post-procedure vital signs reviewed and stable Respiratory status: spontaneous breathing, nonlabored ventilation, respiratory function stable and patient connected to nasal cannula oxygen Cardiovascular status: blood pressure returned to baseline and stable Postop Assessment: no apparent nausea or vomiting Anesthetic complications: no    Last Vitals:  Vitals:   01/24/19 1315 01/24/19 1345  BP: (P) 124/87   Pulse: (!) 50 (!) 50  Resp: (!) 21 (!) 21  Temp: (!) (P) 36.2 C   SpO2: 100% 100%    Last Pain:  Vitals:   01/24/19 1345  TempSrc:   PainSc: 0-No pain                 Ellionna Buckbee

## 2019-01-27 ENCOUNTER — Encounter (HOSPITAL_COMMUNITY): Payer: Self-pay | Admitting: Obstetrics and Gynecology

## 2019-02-04 DIAGNOSIS — Z1379 Encounter for other screening for genetic and chromosomal anomalies: Secondary | ICD-10-CM | POA: Diagnosis not present

## 2019-02-28 DIAGNOSIS — M9901 Segmental and somatic dysfunction of cervical region: Secondary | ICD-10-CM | POA: Diagnosis not present

## 2019-02-28 DIAGNOSIS — M9905 Segmental and somatic dysfunction of pelvic region: Secondary | ICD-10-CM | POA: Diagnosis not present

## 2019-02-28 DIAGNOSIS — M545 Low back pain: Secondary | ICD-10-CM | POA: Diagnosis not present

## 2019-02-28 DIAGNOSIS — M5386 Other specified dorsopathies, lumbar region: Secondary | ICD-10-CM | POA: Diagnosis not present

## 2019-02-28 DIAGNOSIS — H6120 Impacted cerumen, unspecified ear: Secondary | ICD-10-CM | POA: Diagnosis not present

## 2019-02-28 DIAGNOSIS — M9903 Segmental and somatic dysfunction of lumbar region: Secondary | ICD-10-CM | POA: Diagnosis not present

## 2019-02-28 DIAGNOSIS — R102 Pelvic and perineal pain: Secondary | ICD-10-CM | POA: Diagnosis not present

## 2019-04-07 DIAGNOSIS — M5386 Other specified dorsopathies, lumbar region: Secondary | ICD-10-CM | POA: Diagnosis not present

## 2019-04-07 DIAGNOSIS — M9905 Segmental and somatic dysfunction of pelvic region: Secondary | ICD-10-CM | POA: Diagnosis not present

## 2019-04-07 DIAGNOSIS — M9901 Segmental and somatic dysfunction of cervical region: Secondary | ICD-10-CM | POA: Diagnosis not present

## 2019-04-07 DIAGNOSIS — M9903 Segmental and somatic dysfunction of lumbar region: Secondary | ICD-10-CM | POA: Diagnosis not present

## 2019-04-08 DIAGNOSIS — R102 Pelvic and perineal pain: Secondary | ICD-10-CM | POA: Diagnosis not present

## 2019-04-08 DIAGNOSIS — N912 Amenorrhea, unspecified: Secondary | ICD-10-CM | POA: Diagnosis not present

## 2019-04-11 DIAGNOSIS — Z3201 Encounter for pregnancy test, result positive: Secondary | ICD-10-CM | POA: Diagnosis not present

## 2019-04-25 ENCOUNTER — Other Ambulatory Visit: Payer: Self-pay

## 2019-04-25 ENCOUNTER — Inpatient Hospital Stay (HOSPITAL_COMMUNITY)
Admission: EM | Admit: 2019-04-25 | Discharge: 2019-04-26 | Disposition: A | Discharge status: Home / Self Care | Payer: BC Managed Care – PPO | Attending: Obstetrics and Gynecology | Admitting: Obstetrics and Gynecology

## 2019-04-25 DIAGNOSIS — O26892 Other specified pregnancy related conditions, second trimester: Secondary | ICD-10-CM | POA: Insufficient documentation

## 2019-04-25 DIAGNOSIS — Z79899 Other long term (current) drug therapy: Secondary | ICD-10-CM | POA: Insufficient documentation

## 2019-04-25 DIAGNOSIS — O26899 Other specified pregnancy related conditions, unspecified trimester: Secondary | ICD-10-CM

## 2019-04-25 DIAGNOSIS — O99281 Endocrine, nutritional and metabolic diseases complicating pregnancy, first trimester: Secondary | ICD-10-CM | POA: Insufficient documentation

## 2019-04-25 DIAGNOSIS — Z86718 Personal history of other venous thrombosis and embolism: Secondary | ICD-10-CM | POA: Insufficient documentation

## 2019-04-25 DIAGNOSIS — E860 Dehydration: Secondary | ICD-10-CM | POA: Insufficient documentation

## 2019-04-25 DIAGNOSIS — R109 Unspecified abdominal pain: Secondary | ICD-10-CM

## 2019-04-25 DIAGNOSIS — O210 Mild hyperemesis gravidarum: Secondary | ICD-10-CM | POA: Insufficient documentation

## 2019-04-25 DIAGNOSIS — O99611 Diseases of the digestive system complicating pregnancy, first trimester: Secondary | ICD-10-CM | POA: Insufficient documentation

## 2019-04-25 DIAGNOSIS — K219 Gastro-esophageal reflux disease without esophagitis: Secondary | ICD-10-CM | POA: Insufficient documentation

## 2019-04-25 DIAGNOSIS — Z3A01 Less than 8 weeks gestation of pregnancy: Secondary | ICD-10-CM | POA: Insufficient documentation

## 2019-04-26 ENCOUNTER — Encounter (HOSPITAL_COMMUNITY): Payer: Self-pay | Admitting: Obstetrics and Gynecology

## 2019-04-26 ENCOUNTER — Inpatient Hospital Stay (HOSPITAL_COMMUNITY): Payer: BC Managed Care – PPO

## 2019-04-26 DIAGNOSIS — O99281 Endocrine, nutritional and metabolic diseases complicating pregnancy, first trimester: Secondary | ICD-10-CM | POA: Diagnosis not present

## 2019-04-26 DIAGNOSIS — O26891 Other specified pregnancy related conditions, first trimester: Secondary | ICD-10-CM | POA: Diagnosis not present

## 2019-04-26 DIAGNOSIS — Z86718 Personal history of other venous thrombosis and embolism: Secondary | ICD-10-CM | POA: Diagnosis not present

## 2019-04-26 DIAGNOSIS — Z79899 Other long term (current) drug therapy: Secondary | ICD-10-CM | POA: Diagnosis not present

## 2019-04-26 DIAGNOSIS — O99611 Diseases of the digestive system complicating pregnancy, first trimester: Secondary | ICD-10-CM | POA: Diagnosis not present

## 2019-04-26 DIAGNOSIS — O210 Mild hyperemesis gravidarum: Secondary | ICD-10-CM | POA: Diagnosis not present

## 2019-04-26 DIAGNOSIS — E860 Dehydration: Secondary | ICD-10-CM | POA: Diagnosis not present

## 2019-04-26 DIAGNOSIS — K219 Gastro-esophageal reflux disease without esophagitis: Secondary | ICD-10-CM | POA: Diagnosis not present

## 2019-04-26 DIAGNOSIS — R109 Unspecified abdominal pain: Secondary | ICD-10-CM

## 2019-04-26 DIAGNOSIS — Z3A01 Less than 8 weeks gestation of pregnancy: Secondary | ICD-10-CM | POA: Diagnosis not present

## 2019-04-26 DIAGNOSIS — O219 Vomiting of pregnancy, unspecified: Secondary | ICD-10-CM | POA: Diagnosis not present

## 2019-04-26 DIAGNOSIS — O26892 Other specified pregnancy related conditions, second trimester: Secondary | ICD-10-CM | POA: Diagnosis not present

## 2019-04-26 DIAGNOSIS — R102 Pelvic and perineal pain: Secondary | ICD-10-CM | POA: Diagnosis not present

## 2019-04-26 LAB — URINALYSIS, ROUTINE W REFLEX MICROSCOPIC
Bilirubin Urine: NEGATIVE
Glucose, UA: NEGATIVE mg/dL
Hgb urine dipstick: NEGATIVE
Ketones, ur: 80 mg/dL — AB
Leukocytes,Ua: NEGATIVE
Nitrite: NEGATIVE
Protein, ur: 100 mg/dL — AB
Specific Gravity, Urine: 1.031 — ABNORMAL HIGH (ref 1.005–1.030)
pH: 5 (ref 5.0–8.0)

## 2019-04-26 LAB — CBC
HCT: 38.9 % (ref 36.0–46.0)
Hemoglobin: 13.6 g/dL (ref 12.0–15.0)
MCH: 28.8 pg (ref 26.0–34.0)
MCHC: 35 g/dL (ref 30.0–36.0)
MCV: 82.4 fL (ref 80.0–100.0)
Platelets: 290 10*3/uL (ref 150–400)
RBC: 4.72 MIL/uL (ref 3.87–5.11)
RDW: 12.4 % (ref 11.5–15.5)
WBC: 13.4 10*3/uL — ABNORMAL HIGH (ref 4.0–10.5)
nRBC: 0 % (ref 0.0–0.2)

## 2019-04-26 LAB — COMPREHENSIVE METABOLIC PANEL
ALT: 19 U/L (ref 0–44)
AST: 19 U/L (ref 15–41)
Albumin: 3.9 g/dL (ref 3.5–5.0)
Alkaline Phosphatase: 52 U/L (ref 38–126)
Anion gap: 12 (ref 5–15)
BUN: 7 mg/dL (ref 6–20)
CO2: 19 mmol/L — ABNORMAL LOW (ref 22–32)
Calcium: 9.3 mg/dL (ref 8.9–10.3)
Chloride: 105 mmol/L (ref 98–111)
Creatinine, Ser: 0.71 mg/dL (ref 0.44–1.00)
GFR calc Af Amer: >60 mL/min (ref >60)
GFR calc non Af Amer: >60 mL/min (ref >60)
Glucose, Bld: 110 mg/dL — ABNORMAL HIGH (ref 70–99)
Potassium: 3.6 mmol/L (ref 3.5–5.1)
Sodium: 136 mmol/L (ref 135–145)
Total Bilirubin: 0.8 mg/dL (ref 0.3–1.2)
Total Protein: 7.1 g/dL (ref 6.5–8.1)

## 2019-04-26 LAB — WET PREP, GENITAL
Sperm: NONE SEEN
Trich, Wet Prep: NONE SEEN
Yeast Wet Prep HPF POC: NONE SEEN

## 2019-04-26 LAB — LIPASE, BLOOD: Lipase: 30 U/L (ref 11–51)

## 2019-04-26 LAB — POCT PREGNANCY, URINE: Preg Test, Ur: POSITIVE — AB

## 2019-04-26 MED ORDER — SODIUM CHLORIDE 0.9 % IV SOLN
8.0000 mg | Freq: Once | INTRAVENOUS | Status: AC
  Administered 2019-04-26: 8 mg via INTRAVENOUS
  Filled 2019-04-26: qty 4

## 2019-04-26 MED ORDER — SCOPOLAMINE 1 MG/3DAYS TD PT72: 1.0000 | MEDICATED_PATCH | TRANSDERMAL | 0 refills | Status: DC

## 2019-04-26 MED ORDER — SODIUM CHLORIDE 0.9 % IV SOLN
8.0000 mg | Freq: Once | INTRAVENOUS | Status: DC
  Filled 2019-04-26: qty 4

## 2019-04-26 MED ORDER — PROMETHAZINE HCL 25 MG/ML IJ SOLN
25.0000 mg | Freq: Four times a day (QID) | INTRAMUSCULAR | Status: DC | PRN
  Administered 2019-04-26: 25 mg via INTRAVENOUS
  Filled 2019-04-26: qty 1

## 2019-04-26 MED ORDER — LACTATED RINGERS IV BOLUS
1000.0000 mL | Freq: Once | INTRAVENOUS | Status: AC
  Administered 2019-04-26: 1000 mL via INTRAVENOUS

## 2019-04-26 MED ORDER — SCOPOLAMINE 1 MG/3DAYS TD PT72
1.0000 | MEDICATED_PATCH | TRANSDERMAL | Status: DC
  Administered 2019-04-26: 1.5 mg via TRANSDERMAL
  Filled 2019-04-26: qty 1

## 2019-04-26 MED ORDER — FAMOTIDINE IN NACL 20-0.9 MG/50ML-% IV SOLN
20.0000 mg | Freq: Once | INTRAVENOUS | Status: AC
  Administered 2019-04-26: 20 mg via INTRAVENOUS
  Filled 2019-04-26: qty 50

## 2019-04-26 MED ORDER — PROMETHAZINE HCL 25 MG PO TABS: 12.5000 mg | ORAL_TABLET | Freq: Four times a day (QID) | ORAL | 0 refills | Status: DC | PRN
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
## 2019-04-26 NOTE — Discharge Instructions (Signed)
Morning Sickness  Morning sickness is when you feel sick to your stomach (nauseous) during pregnancy. You may feel sick to your stomach and throw up (vomit). You may feel sick in the morning, but you can feel this way at any time of day. Some women feel very sick to their stomach and cannot stop throwing up (hyperemesis gravidarum). Follow these instructions at home: Medicines  Take over-the-counter and prescription medicines only as told by your doctor. Do not take any medicines until you talk with your doctor about them first.  Taking multivitamins before getting pregnant can stop or lessen the harshness of morning sickness. Eating and drinking  Eat dry toast or crackers before getting out of bed.  Eat 5 or 6 small meals a day.  Eat dry and bland foods like rice and baked potatoes.  Do not eat greasy, fatty, or spicy foods.  Have someone cook for you if the smell of food causes you to feel sick or throw up.  If you feel sick to your stomach after taking prenatal vitamins, take them at night or with a snack.  Eat protein when you need a snack. Nuts, yogurt, and cheese are good choices.  Drink fluids throughout the day.  Try ginger ale made with real ginger, ginger tea made from fresh grated ginger, or ginger candies. General instructions  Do not use any products that have nicotine or tobacco in them, such as cigarettes and e-cigarettes. If you need help quitting, ask your doctor.  Use an air purifier to keep the air in your house free of smells.  Get lots of fresh air.  Try to avoid smells that make you feel sick.  Try: ? Wearing a bracelet that is used for seasickness (acupressure wristband). ? Going to a doctor who puts thin needles into certain body points (acupuncture) to improve how you feel. Contact a doctor if:  You need medicine to feel better.  You feel dizzy or light-headed.  You are losing weight. Get help right away if:  You feel very sick to your  stomach and cannot stop throwing up.  You pass out (faint).  You have very bad pain in your belly. Summary  Morning sickness is when you feel sick to your stomach (nauseous) during pregnancy.  You may feel sick in the morning, but you can feel this way at any time of day.  Making some changes to what you eat may help your symptoms go away. This information is not intended to replace advice given to you by your health care provider. Make sure you discuss any questions you have with your health care provider. Document Released: 06/08/2004 Document Revised: 04/13/2017 Document Reviewed: 06/01/2016 Elsevier Patient Education  2020 Elsevier Inc.   Abdominal Pain During Pregnancy  Belly (abdominal) pain is common during pregnancy. There are many possible causes. Most of the time, it is not a serious problem. Other times, it can be a sign that something is wrong with the pregnancy. Always tell your doctor if you have belly pain. Follow these instructions at home:  Do not have sex or put anything in your vagina until your pain goes away completely.  Get plenty of rest until your pain gets better.  Drink enough fluid to keep your pee (urine) pale yellow.  Take over-the-counter and prescription medicines only as told by your doctor.  Keep all follow-up visits as told by your doctor. This is important. Contact a doctor if:  Your pain continues or gets worse after resting.  You have lower belly pain that: ? Comes and goes at regular times. ? Spreads to your back. ? Feels like menstrual cramps.  You have pain or burning when you pee (urinate). Get help right away if:  You have a fever or chills.  You have vaginal bleeding.  You are leaking fluid from your vagina.  You are passing tissue from your vagina.  You throw up (vomit) for more than 24 hours.  You have watery poop (diarrhea) for more than 24 hours.  Your baby is moving less than usual.  You feel very weak or  faint.  You have shortness of breath.  You have very bad pain in your upper belly. Summary  Belly (abdominal) pain is common during pregnancy. There are many possible causes.  If you have belly pain during pregnancy, tell your doctor right away.  Keep all follow-up visits as told by your doctor. This is important. This information is not intended to replace advice given to you by your health care provider. Make sure you discuss any questions you have with your health care provider. Document Released: 04/19/2009 Document Revised: 08/19/2018 Document Reviewed: 08/03/2016 Elsevier Patient Education  2020 [employer].

## 2019-04-26 NOTE — MAU Note (Addendum)
Pt reports to MAU c/o hyperemesis LMP was OCT 22nd 2020. [facility]. No bleeding or LOF. Pt reports abdominal cramping that is a 8/10 that is near her navel. Pt is unsure if she has a hernia. Pt states she has vomited 25-30 times in the last 24 hours.

## 2019-04-28 DIAGNOSIS — R112 Nausea with vomiting, unspecified: Secondary | ICD-10-CM | POA: Diagnosis not present

## 2019-04-28 DIAGNOSIS — N911 Secondary amenorrhea: Secondary | ICD-10-CM | POA: Diagnosis not present

## 2019-04-28 DIAGNOSIS — R141 Gas pain: Secondary | ICD-10-CM | POA: Diagnosis not present

## 2019-04-28 DIAGNOSIS — Z8759 Personal history of other complications of pregnancy, childbirth and the puerperium: Secondary | ICD-10-CM | POA: Diagnosis not present

## 2019-04-28 LAB — GC/CHLAMYDIA PROBE AMP (~~LOC~~) NOT AT ARMC
Chlamydia: NEGATIVE
Comment: NEGATIVE
Comment: NORMAL
Neisseria Gonorrhea: NEGATIVE

## 2019-04-28 LAB — CULTURE, OB URINE: Culture: 70000 — AB

## 2019-04-29 DIAGNOSIS — Z3685 Encounter for antenatal screening for Streptococcus B: Secondary | ICD-10-CM | POA: Diagnosis not present

## 2019-04-29 DIAGNOSIS — Z3481 Encounter for supervision of other normal pregnancy, first trimester: Secondary | ICD-10-CM | POA: Diagnosis not present

## 2019-04-29 LAB — OB RESULTS CONSOLE RPR
RPR: NONREACTIVE
RPR: REACTIVE

## 2019-04-29 LAB — OB RESULTS CONSOLE ABO/RH: RH Type: POSITIVE

## 2019-04-29 LAB — OB RESULTS CONSOLE GC/CHLAMYDIA
Chlamydia: NEGATIVE
Gonorrhea: NEGATIVE

## 2019-04-29 LAB — OB RESULTS CONSOLE ANTIBODY SCREEN: Antibody Screen: NEGATIVE

## 2019-04-29 LAB — OB RESULTS CONSOLE HIV ANTIBODY (ROUTINE TESTING): HIV: NONREACTIVE

## 2019-04-29 LAB — OB RESULTS CONSOLE HEPATITIS B SURFACE ANTIGEN: Hepatitis B Surface Ag: NEGATIVE

## 2019-04-29 LAB — OB RESULTS CONSOLE RUBELLA ANTIBODY, IGM: Rubella: IMMUNE

## 2019-05-12 DIAGNOSIS — Z3400 Encounter for supervision of normal first pregnancy, unspecified trimester: Secondary | ICD-10-CM | POA: Diagnosis not present

## 2019-05-12 DIAGNOSIS — O219 Vomiting of pregnancy, unspecified: Secondary | ICD-10-CM | POA: Diagnosis not present

## 2019-05-12 DIAGNOSIS — Z113 Encounter for screening for infections with a predominantly sexual mode of transmission: Secondary | ICD-10-CM | POA: Diagnosis not present

## 2019-05-16 NOTE — L&D Delivery Note (Signed)
Operative Delivery Note At 3:49 PM a viable female was delivered via Vaginal, [employer], operational).  Presentation: vertex; Position: Occiput,, Anterior; Station: +2. Patient pushed to x 1 hour with as much effort as she could. Despite position changes and pushing, patient unable to push pass +2 station. She then became exhausted and requested assistance from below. We discussed in detail an OVD vs Cesarean section and she desired OVD.   Verbal consent: obtained from patient.  Risks and benefits discussed in detail.  Risks include, but are not limited to the risks of anesthesia, bleeding, infection, damage to maternal tissues, fetal cephalhematoma.  There is also the risk of inability to effect vaginal delivery of the head, or shoulder dystocia that cannot be resolved by established maneuvers, leading to the need for emergency cesarean section.  APGAR: 5, 9; weight  pending.   Placenta status: routine .   Cord:  with the following complications: .  Cord pH: sent  Anesthesia:  CLE Instruments: kiwi Episiotomy: None Lacerations: 2nd degree;Vaginal Suture Repair: 2.0 3.0 vicryl Est. Blood Loss (mL): 718   It's a girl -"Laken"!!   Mom to postpartum.  Baby to Couplet care / Skin to Skin.  Madelaine Etienne Rosalena Mccorry 12/08/2019, 4:21 PM

## 2019-05-29 DIAGNOSIS — M9901 Segmental and somatic dysfunction of cervical region: Secondary | ICD-10-CM | POA: Diagnosis not present

## 2019-05-29 DIAGNOSIS — M9905 Segmental and somatic dysfunction of pelvic region: Secondary | ICD-10-CM | POA: Diagnosis not present

## 2019-05-29 DIAGNOSIS — M5386 Other specified dorsopathies, lumbar region: Secondary | ICD-10-CM | POA: Diagnosis not present

## 2019-05-29 DIAGNOSIS — M9903 Segmental and somatic dysfunction of lumbar region: Secondary | ICD-10-CM | POA: Diagnosis not present

## 2019-06-03 DIAGNOSIS — Z3481 Encounter for supervision of other normal pregnancy, first trimester: Secondary | ICD-10-CM | POA: Diagnosis not present

## 2019-06-03 DIAGNOSIS — Z3682 Encounter for antenatal screening for nuchal translucency: Secondary | ICD-10-CM | POA: Diagnosis not present

## 2019-06-03 DIAGNOSIS — Z3A12 12 weeks gestation of pregnancy: Secondary | ICD-10-CM | POA: Diagnosis not present

## 2019-06-08 ENCOUNTER — Encounter (HOSPITAL_COMMUNITY): Payer: Self-pay

## 2019-06-08 ENCOUNTER — Other Ambulatory Visit: Payer: Self-pay

## 2019-06-08 ENCOUNTER — Inpatient Hospital Stay (HOSPITAL_COMMUNITY)
Admission: AD | Admit: 2019-06-08 | Discharge: 2019-06-08 | Disposition: A | Discharge status: Home / Self Care | Payer: BC Managed Care – PPO | Attending: Obstetrics & Gynecology | Admitting: Obstetrics & Gynecology

## 2019-06-08 DIAGNOSIS — G44209 Tension-type headache, unspecified, not intractable: Secondary | ICD-10-CM | POA: Diagnosis not present

## 2019-06-08 DIAGNOSIS — Z86718 Personal history of other venous thrombosis and embolism: Secondary | ICD-10-CM | POA: Diagnosis not present

## 2019-06-08 DIAGNOSIS — R112 Nausea with vomiting, unspecified: Secondary | ICD-10-CM | POA: Diagnosis not present

## 2019-06-08 DIAGNOSIS — O210 Mild hyperemesis gravidarum: Secondary | ICD-10-CM | POA: Insufficient documentation

## 2019-06-08 DIAGNOSIS — Z3A13 13 weeks gestation of pregnancy: Secondary | ICD-10-CM | POA: Diagnosis not present

## 2019-06-08 DIAGNOSIS — Z91040 Latex allergy status: Secondary | ICD-10-CM | POA: Diagnosis not present

## 2019-06-08 DIAGNOSIS — O99281 Endocrine, nutritional and metabolic diseases complicating pregnancy, first trimester: Secondary | ICD-10-CM | POA: Diagnosis not present

## 2019-06-08 DIAGNOSIS — O09521 Supervision of elderly multigravida, first trimester: Secondary | ICD-10-CM | POA: Insufficient documentation

## 2019-06-08 DIAGNOSIS — O99351 Diseases of the nervous system complicating pregnancy, first trimester: Secondary | ICD-10-CM | POA: Diagnosis not present

## 2019-06-08 DIAGNOSIS — E860 Dehydration: Secondary | ICD-10-CM | POA: Insufficient documentation

## 2019-06-08 LAB — CBC
HCT: 34.5 % — ABNORMAL LOW (ref 36.0–46.0)
Hemoglobin: 12.2 g/dL (ref 12.0–15.0)
MCH: 29.5 pg (ref 26.0–34.0)
MCHC: 35.4 g/dL (ref 30.0–36.0)
MCV: 83.3 fL (ref 80.0–100.0)
Platelets: 260 10*3/uL (ref 150–400)
RBC: 4.14 MIL/uL (ref 3.87–5.11)
RDW: 13.3 % (ref 11.5–15.5)
WBC: 11.5 10*3/uL — ABNORMAL HIGH (ref 4.0–10.5)
nRBC: 0 % (ref 0.0–0.2)

## 2019-06-08 LAB — BASIC METABOLIC PANEL
Anion gap: 9 (ref 5–15)
BUN: 6 mg/dL (ref 6–20)
CO2: 21 mmol/L — ABNORMAL LOW (ref 22–32)
Calcium: 8.8 mg/dL — ABNORMAL LOW (ref 8.9–10.3)
Chloride: 105 mmol/L (ref 98–111)
Creatinine, Ser: 0.63 mg/dL (ref 0.44–1.00)
GFR calc Af Amer: >60 mL/min (ref >60)
GFR calc non Af Amer: >60 mL/min (ref >60)
Glucose, Bld: 96 mg/dL (ref 70–99)
Potassium: 3.6 mmol/L (ref 3.5–5.1)
Sodium: 135 mmol/L (ref 135–145)

## 2019-06-08 LAB — URINALYSIS, ROUTINE W REFLEX MICROSCOPIC
Bilirubin Urine: NEGATIVE
Glucose, UA: NEGATIVE mg/dL
Hgb urine dipstick: NEGATIVE
Ketones, ur: 80 mg/dL — AB
Leukocytes,Ua: NEGATIVE
Nitrite: NEGATIVE
Protein, ur: 30 mg/dL — AB
Specific Gravity, Urine: 1.023 (ref 1.005–1.030)
pH: 5 (ref 5.0–8.0)

## 2019-06-08 MED ORDER — METOCLOPRAMIDE HCL 5 MG/ML IJ SOLN
10.0000 mg | Freq: Once | INTRAMUSCULAR | Status: AC
  Administered 2019-06-08: 10 mg via INTRAVENOUS
  Filled 2019-06-08: qty 2

## 2019-06-08 MED ORDER — DIPHENHYDRAMINE HCL 50 MG/ML IJ SOLN
25.0000 mg | Freq: Once | INTRAMUSCULAR | Status: AC
  Administered 2019-06-08: 19:00:00 25 mg via INTRAVENOUS
  Filled 2019-06-08: qty 1

## 2019-06-08 MED ORDER — METOCLOPRAMIDE HCL 10 MG PO TABS: 10.0000 mg | ORAL_TABLET | Freq: Four times a day (QID) | ORAL | 0 refills | Status: DC | PRN

## 2019-06-08 MED ORDER — FAMOTIDINE IN NACL 20-0.9 MG/50ML-% IV SOLN
20.0000 mg | Freq: Once | INTRAVENOUS | Status: AC
  Administered 2019-06-08: 20 mg via INTRAVENOUS
  Filled 2019-06-08: qty 50

## 2019-06-08 MED ORDER — LACTATED RINGERS IV BOLUS
1000.0000 mL | Freq: Once | INTRAVENOUS | Status: AC
  Administered 2019-06-08: 1000 mL via INTRAVENOUS

## 2019-06-08 MED ORDER — DEXAMETHASONE SODIUM PHOSPHATE 10 MG/ML IJ SOLN
10.0000 mg | Freq: Once | INTRAMUSCULAR | Status: AC
  Administered 2019-06-08: 10 mg via INTRAVENOUS
  Filled 2019-06-08: qty 1

## 2019-06-08 NOTE — Discharge Instructions (Signed)
Morning Sickness ° °Morning sickness is when you feel sick to your stomach (nauseous) during pregnancy. You may feel sick to your stomach and throw up (vomit). You may feel sick in the morning, but you can feel this way at any time of day. Some women feel very sick to their stomach and cannot stop throwing up (hyperemesis gravidarum). °Follow these instructions at home: °Medicines °· Take over-the-counter and prescription medicines only as told by your doctor. Do not take any medicines until you talk with your doctor about them first. °· Taking multivitamins before getting pregnant can stop or lessen the harshness of morning sickness. °Eating and drinking °· Eat dry toast or crackers before getting out of bed. °· Eat 5 or 6 small meals a day. °· Eat dry and bland foods like rice and baked potatoes. °· Do not eat greasy, fatty, or spicy foods. °· Have someone cook for you if the smell of food causes you to feel sick or throw up. °· If you feel sick to your stomach after taking prenatal vitamins, take them at night or with a snack. °· Eat protein when you need a snack. Nuts, yogurt, and cheese are good choices. °· Drink fluids throughout the day. °· Try ginger ale made with real ginger, ginger tea made from fresh grated ginger, or ginger candies. °General instructions °· Do not use any products that have nicotine or tobacco in them, such as cigarettes and e-cigarettes. If you need help quitting, ask your doctor. °· Use an air purifier to keep the air in your house free of smells. °· Get lots of fresh air. °· Try to avoid smells that make you feel sick. °· Try: °? Wearing a bracelet that is used for seasickness (acupressure wristband). °? Going to a doctor who puts thin needles into certain body points (acupuncture) to improve how you feel. °Contact a doctor if: °· You need medicine to feel better. °· You feel dizzy or light-headed. °· You are losing weight. °Get help right away if: °· You feel very sick to your  stomach and cannot stop throwing up. °· You pass out (faint). °· You have very bad pain in your belly. °Summary °· Morning sickness is when you feel sick to your stomach (nauseous) during pregnancy. °· You may feel sick in the morning, but you can feel this way at any time of day. °· Making some changes to what you eat may help your symptoms go away. °This information is not intended to replace advice given to you by your health care provider. Make sure you discuss any questions you have with your health care provider. °Document Revised: 04/13/2017 Document Reviewed: 06/01/2016 °Elsevier Patient Education © 2020 Elsevier Inc. ° °

## 2019-06-08 NOTE — MAU Note (Signed)
Ashley Jensen is a 34 y.o. at [redacted]w[redacted]d here in MAU reporting: states she has been vomiting for 3 days. Anytime she eats or drinks it comes back up. Emesis x 5 in the last 24 hours. Having waves of nausea in between vomiting episodes. Had been taking phenergan daily since last MAU visit so she tried to wean off of it 3 days ago, has tried to take it since the vomiting started back but she was unable to keep the medication down. Is still using scop patch. Migraine for 2 days.  Onset of complaint: 3 days  Pain score: 9/10  Vitals:   06/08/19 1812  BP: 118/83  Pulse: 83  Resp: 16  Temp: 98.3 F (36.8 C)  SpO2: 100%     Lab orders placed from triage: UA
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
## 2019-06-10 LAB — CULTURE, OB URINE

## 2019-06-12 DIAGNOSIS — Z3480 Encounter for supervision of other normal pregnancy, unspecified trimester: Secondary | ICD-10-CM | POA: Diagnosis not present

## 2019-06-18 DIAGNOSIS — M9905 Segmental and somatic dysfunction of pelvic region: Secondary | ICD-10-CM | POA: Diagnosis not present

## 2019-06-18 DIAGNOSIS — M5386 Other specified dorsopathies, lumbar region: Secondary | ICD-10-CM | POA: Diagnosis not present

## 2019-06-18 DIAGNOSIS — M9901 Segmental and somatic dysfunction of cervical region: Secondary | ICD-10-CM | POA: Diagnosis not present

## 2019-06-18 DIAGNOSIS — M9903 Segmental and somatic dysfunction of lumbar region: Secondary | ICD-10-CM | POA: Diagnosis not present

## 2019-07-11 DIAGNOSIS — Z363 Encounter for antenatal screening for malformations: Secondary | ICD-10-CM | POA: Diagnosis not present

## 2019-07-11 DIAGNOSIS — Z3A18 18 weeks gestation of pregnancy: Secondary | ICD-10-CM | POA: Diagnosis not present

## 2019-07-11 DIAGNOSIS — Z361 Encounter for antenatal screening for raised alphafetoprotein level: Secondary | ICD-10-CM | POA: Diagnosis not present

## 2019-07-12 ENCOUNTER — Encounter (HOSPITAL_COMMUNITY): Payer: Self-pay | Admitting: Obstetrics and Gynecology

## 2019-07-12 ENCOUNTER — Other Ambulatory Visit: Payer: Self-pay

## 2019-07-12 ENCOUNTER — Inpatient Hospital Stay (HOSPITAL_COMMUNITY)
Admission: AD | Admit: 2019-07-12 | Discharge: 2019-07-12 | Disposition: A | Discharge status: Home / Self Care | Payer: BC Managed Care – PPO | Attending: Obstetrics and Gynecology | Admitting: Obstetrics and Gynecology

## 2019-07-12 DIAGNOSIS — Y9301 Activity, walking, marching and hiking: Secondary | ICD-10-CM | POA: Diagnosis not present

## 2019-07-12 DIAGNOSIS — W19XXXA Unspecified fall, initial encounter: Secondary | ICD-10-CM | POA: Diagnosis not present

## 2019-07-12 DIAGNOSIS — O9A212 Injury, poisoning and certain other consequences of external causes complicating pregnancy, second trimester: Secondary | ICD-10-CM

## 2019-07-12 DIAGNOSIS — Z86718 Personal history of other venous thrombosis and embolism: Secondary | ICD-10-CM | POA: Diagnosis not present

## 2019-07-12 DIAGNOSIS — Z3A18 18 weeks gestation of pregnancy: Secondary | ICD-10-CM | POA: Diagnosis not present

## 2019-07-12 DIAGNOSIS — M25559 Pain in unspecified hip: Secondary | ICD-10-CM | POA: Insufficient documentation

## 2019-07-12 DIAGNOSIS — O26892 Other specified pregnancy related conditions, second trimester: Secondary | ICD-10-CM | POA: Diagnosis not present

## 2019-07-12 DIAGNOSIS — Y92009 Unspecified place in unspecified non-institutional (private) residence as the place of occurrence of the external cause: Secondary | ICD-10-CM | POA: Insufficient documentation

## 2019-07-12 LAB — URINALYSIS, ROUTINE W REFLEX MICROSCOPIC
Bilirubin Urine: NEGATIVE
Glucose, UA: NEGATIVE mg/dL
Hgb urine dipstick: NEGATIVE
Ketones, ur: NEGATIVE mg/dL
Leukocytes,Ua: NEGATIVE
Nitrite: NEGATIVE
Protein, ur: NEGATIVE mg/dL
Specific Gravity, Urine: 1.013 (ref 1.005–1.030)
pH: 5 (ref 5.0–8.0)
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
## 2019-07-12 NOTE — Discharge Instructions (Signed)
Preventing Injuries During Pregnancy  Injuries can happen during pregnancy. Minor falls and accidents usually do not harm you or your baby. But some injuries can harm you and your baby. Tell your doctor about any injury you suffer. What can I do to avoid injuries? Safety  Remove rugs and loose objects on the floor.  Wear comfortable shoes that have a good grip. Do not wear shoes that have high heels.  Always wear your seat belt in the car. The lap belt should be below your belly. Always drive safely.  Do not ride on a motorcycle. Activity  Do not take part in rough and violent activities or sports.  Avoid: ? Walking on wet or slippery floors. ? Lifting heavy pots of boiling or hot liquids. ? Fixing electrical problems. ? Being near fires. General instructions  Take over-the-counter and prescription medicines only as told by your doctor.  Know your blood type and the blood type of the baby's father.  If you are a victim of domestic violence: ? Call your local emergency services (911 in the [place] ? Contact the National Domestic Violence Hotline for help and support. Get help right away if:  You fall on your belly or receive any serious blow to your belly.  You have a stiff neck or neck pain after a fall or an injury.  You get a headache or have problems with vision after an injury.  You do not feel the baby move or the baby is not moving as much as normal.  You have been a victim of domestic violence or any other kind of attack.  You have been in a car accident.  You have bleeding from your vagina.  Fluid is leaking from your vagina.  You start to have cramping or pain in your belly (contractions).  You have very bad pain in your lower back.  You feel weak or pass out (faint).  You start to throw up (vomit) after an injury.  You have been burned. Summary  Some injuries that happen during pregnancy can do harm to the baby.  Tell your doctor about any  injury.  Take steps to avoid injury. This includes removing rugs and loose objects on the floor. Always wear your seat belt in the car.  Do not take part in rough and violent activities or sports.  Get help right away if you have any serious accident or injury. This information is not intended to replace advice given to you by your health care provider. Make sure you discuss any questions you have with your health care provider. Document Revised: 01/24/2019 Document Reviewed: 05/10/2016 Elsevier Patient Education  2020 Elsevier Inc.  

## 2019-07-12 NOTE — MAU Note (Signed)
Ashley Jensen is a 34 y.o. at [redacted]w[redacted]d here in MAU reporting: states she was emptying the dishwasher and backed up to put something away and tripped over the dishwasher. Reports that her husband thinks she hit her head. Unsure if she blacked out but does not remember hitting her head, denies headache but does feel tired. Pt reports her right hip is hurting and has some scratches in that area. Denies vaginal bleeding and LOF.  Onset of complaint: today  Pain score: 8/10  Vitals:   07/12/19 1446  BP: 110/73  Pulse: 68  Resp: 16  Temp: 98.4 F (36.9 C)  SpO2: 97%     FHT: 145  Lab orders placed from triage: UA

## 2019-07-14 DIAGNOSIS — M9903 Segmental and somatic dysfunction of lumbar region: Secondary | ICD-10-CM | POA: Diagnosis not present

## 2019-07-14 DIAGNOSIS — M530 Cervicocranial syndrome: Secondary | ICD-10-CM | POA: Diagnosis not present

## 2019-07-14 DIAGNOSIS — M9901 Segmental and somatic dysfunction of cervical region: Secondary | ICD-10-CM | POA: Diagnosis not present

## 2019-07-14 DIAGNOSIS — M9902 Segmental and somatic dysfunction of thoracic region: Secondary | ICD-10-CM | POA: Diagnosis not present

## 2019-07-16 ENCOUNTER — Other Ambulatory Visit: Payer: Self-pay

## 2019-07-16 ENCOUNTER — Encounter (HOSPITAL_COMMUNITY): Payer: Self-pay | Admitting: Obstetrics and Gynecology

## 2019-07-16 ENCOUNTER — Inpatient Hospital Stay (HOSPITAL_COMMUNITY)
Admission: AD | Admit: 2019-07-16 | Discharge: 2019-07-16 | Disposition: A | Discharge status: Home / Self Care | Payer: BC Managed Care – PPO | Attending: Obstetrics and Gynecology | Admitting: Obstetrics and Gynecology

## 2019-07-16 DIAGNOSIS — R42 Dizziness and giddiness: Secondary | ICD-10-CM | POA: Diagnosis not present

## 2019-07-16 DIAGNOSIS — Z3A18 18 weeks gestation of pregnancy: Secondary | ICD-10-CM

## 2019-07-16 DIAGNOSIS — E162 Hypoglycemia, unspecified: Secondary | ICD-10-CM

## 2019-07-16 DIAGNOSIS — Z86718 Personal history of other venous thrombosis and embolism: Secondary | ICD-10-CM | POA: Insufficient documentation

## 2019-07-16 DIAGNOSIS — O26892 Other specified pregnancy related conditions, second trimester: Secondary | ICD-10-CM | POA: Diagnosis not present

## 2019-07-16 LAB — GLUCOSE, CAPILLARY: Glucose-Capillary: 97 mg/dL (ref 70–99)

## 2019-07-16 LAB — URINALYSIS, ROUTINE W REFLEX MICROSCOPIC
Bilirubin Urine: NEGATIVE
Glucose, UA: NEGATIVE mg/dL
Hgb urine dipstick: NEGATIVE
Ketones, ur: NEGATIVE mg/dL
Leukocytes,Ua: NEGATIVE
Nitrite: NEGATIVE
Protein, ur: NEGATIVE mg/dL
Specific Gravity, Urine: 1.014 (ref 1.005–1.030)
pH: 7 (ref 5.0–8.0)

## 2019-07-16 MED ORDER — LACTATED RINGERS IV BOLUS
1000.0000 mL | Freq: Once | INTRAVENOUS | Status: AC
  Administered 2019-07-16: 1000 mL via INTRAVENOUS

## 2019-07-16 MED ORDER — ONDANSETRON 4 MG PO TBDP
4.0000 mg | ORAL_TABLET | Freq: Once | ORAL | Status: AC
  Administered 2019-07-16: 4 mg via ORAL
  Filled 2019-07-16: qty 1

## 2019-07-16 NOTE — Discharge Instructions (Signed)
Hypoglycemia Hypoglycemia occurs when the level of sugar (glucose) in the blood is too low. Hypoglycemia can happen in people who do or do not have diabetes. It can develop quickly, and it can be a medical emergency. For most people with diabetes, a blood glucose level below 70 mg/dL (3.9 mmol/L) is considered hypoglycemia. Glucose is a type of sugar that provides the body's main source of energy. Certain hormones (insulin and glucagon) control the level of glucose in the blood. Insulin lowers blood glucose, and glucagon raises blood glucose. Hypoglycemia can result from having too much insulin in the bloodstream, or from not eating enough food that contains glucose. You may also have reactive hypoglycemia, which happens within 4 hours after eating a meal. What are the causes? Hypoglycemia occurs most often in people who have diabetes and may be caused by:  Diabetes medicine.  Not eating enough, or not eating often enough.  Increased physical activity.  Drinking alcohol on an empty stomach. If you do not have diabetes, hypoglycemia may be caused by:  A tumor in the pancreas.  Not eating enough, or not eating for long periods at a time (fasting).  A severe infection or illness.  Certain medicines. What increases the risk? Hypoglycemia is more likely to develop in:  People who have diabetes and take medicines to lower blood glucose.  People who abuse alcohol.  People who have a severe illness. What are the signs or symptoms? Mild symptoms Mild hypoglycemia may not cause any symptoms. If you do have symptoms, they may include:  Hunger.  Anxiety.  Sweating and feeling clammy.  Dizziness or feeling light-headed.  Sleepiness.  Nausea.  Increased heart rate.  Headache.  Blurry vision.  Irritability.  Tingling or numbness around the mouth, lips, or tongue.  A change in coordination.  Restless sleep. Moderate symptoms Moderate hypoglycemia can cause:  Mental  confusion and poor judgment.  Behavior changes.  Weakness.  Irregular heartbeat. Severe symptoms Severe hypoglycemia is a medical emergency. It can cause:  Fainting.  Seizures.  Loss of consciousness (coma).  Death. How is this diagnosed? Hypoglycemia is diagnosed with a blood test to measure your blood glucose level. This blood test is done while you are having symptoms. Your health care provider may also do a physical exam and review your medical history. How is this treated? This condition can often be treated by immediately eating or drinking something that contains sugar, such as:  Fruit juice, 4-6 oz (120-150 mL).  Regular soda (not diet soda), 4-6 oz (120-150 mL).  Low-fat milk, 4 oz (120 mL).  Several pieces of hard candy.  Sugar or honey, 1 Tbsp (15 mL). Treating hypoglycemia if you have diabetes If you are alert and able to swallow safely, follow the 15:15 rule:  Take 15 grams of a rapid-acting carbohydrate. Talk with your health care provider about how much you should take.  Rapid-acting options include: ? Glucose pills (take 15 grams). ? 6-8 pieces of hard candy. ? 4-6 oz (120-150 mL) of fruit juice. ? 4-6 oz (120-150 mL) of regular (not diet) soda. ? 1 Tbsp (15 mL) honey or sugar.  Check your blood glucose 15 minutes after you take the carbohydrate.  If the repeat blood glucose level is still at or below 70 mg/dL (3.9 mmol/L), take 15 grams of a carbohydrate again.  If your blood glucose level does not increase above 70 mg/dL (3.9 mmol/L) after 3 tries, seek emergency medical care.  After your blood glucose level returns   to normal, eat a meal or a snack within 1 hour.  Treating severe hypoglycemia Severe hypoglycemia is when your blood glucose level is at or below 54 mg/dL (3 mmol/L). Severe hypoglycemia is a medical emergency. Get medical help right away. If you have severe hypoglycemia and you cannot eat or drink, you may need an injection of  glucagon. A family member or close friend should learn how to check your blood glucose and how to give you a glucagon injection. Ask your health care provider if you need to have an emergency glucagon injection kit available. Severe hypoglycemia may need to be treated in a hospital. The treatment may include getting glucose through an IV. You may also need treatment for the cause of your hypoglycemia. Follow these instructions at home:  General instructions  Take over-the-counter and prescription medicines only as told by your health care provider.  Monitor your blood glucose as told by your health care provider.  Limit alcohol intake to no more than 1 drink a day for nonpregnant women and 2 drinks a day for men. One drink equals 12 oz of beer (355 mL), 5 oz of wine (148 mL), or 1 oz of hard liquor (44 mL).  Keep all follow-up visits as told by your health care provider. This is important. If you have diabetes:  Always have a rapid-acting carbohydrate snack with you to treat low blood glucose.  Follow your diabetes management plan as directed. Make sure you: ? Know the symptoms of hypoglycemia. It is important to treat it right away to prevent it from becoming severe. ? Take your medicines as directed. ? Follow your exercise plan. ? Follow your meal plan. Eat on time, and do not skip meals. ? Check your blood glucose as often as directed. Always check before and after exercise. ? Follow your sick day plan whenever you cannot eat or drink normally. Make this plan in advance with your health care provider.  Share your diabetes management plan with people in your workplace, school, and household.  Check your urine for ketones when you are ill and as told by your health care provider.  Carry a medical alert card or wear medical alert jewelry. Contact a health care provider if:  You have problems keeping your blood glucose in your target range.  You have frequent episodes of  hypoglycemia. Get help right away if:  You continue to have hypoglycemia symptoms after eating or drinking something containing glucose.  Your blood glucose is at or below 54 mg/dL (3 mmol/L).  You have a seizure.  You faint. These symptoms may represent a serious problem that is an emergency. Do not wait to see if the symptoms will go away. Get medical help right away. Call your local emergency services (911 in the [place] Summary  Hypoglycemia occurs when the level of sugar (glucose) in the blood is too low.  Hypoglycemia can happen in people who do or do not have diabetes. It can develop quickly, and it can be a medical emergency.  Make sure you know the symptoms of hypoglycemia and how to treat it.  Always have a rapid-acting carbohydrate snack with you to treat low blood sugar. This information is not intended to replace advice given to you by your health care provider. Make sure you discuss any questions you have with your health care provider. Document Revised: 10/22/2017 Document Reviewed: 06/04/2015 Elsevier Patient Education  [place]. Dizziness Dizziness is a common problem. It makes you feel unsteady or  light-headed. You may feel like you are about to pass out (faint). Dizziness can lead to getting hurt if you stumble or fall. Dizziness can be caused by many things, including:  Medicines.  Not having enough water in your body (dehydration).  Illness. Follow these instructions at home: Eating and drinking   Drink enough fluid to keep your pee (urine) clear or pale yellow. This helps to keep you from getting dehydrated. Try to drink more clear fluids, such as water.  Do not drink alcohol.  Limit how much caffeine you drink or eat, if your doctor tells you to do that.  Limit how much salt (sodium) you drink or eat, if your doctor tells you to do that. Activity   Avoid making quick movements. ? When you stand up from sitting in a chair, steady yourself  until you feel okay. ? In the morning, first sit up on the side of the bed. When you feel okay, stand slowly while you hold onto something. Do this until you know that your balance is fine.  If you need to stand in one place for a long time, move your legs often. Tighten and relax the muscles in your legs while you are standing.  Do not drive or use heavy machinery if you feel dizzy.  Avoid bending down if you feel dizzy. Place items in your home so you can reach them easily without leaning over. Lifestyle  Do not use any products that contain nicotine or tobacco, such as cigarettes and e-cigarettes. If you need help quitting, ask your doctor.  Try to lower your stress level. You can do this by using methods such as yoga or meditation. Talk with your doctor if you need help. General instructions  Watch your dizziness for any changes.  Take over-the-counter and prescription medicines only as told by your doctor. Talk with your doctor if you think that you are dizzy because of a medicine that you are taking.  Tell a friend or a family member that you are feeling dizzy. If he or she notices any changes in your behavior, have this person call your doctor.  Keep all follow-up visits as told by your doctor. This is important. Contact a doctor if:  Your dizziness does not go away.  Your dizziness or light-headedness gets worse.  You feel sick to your stomach (nauseous).  You have trouble hearing.  You have new symptoms.  You are unsteady on your feet.  You feel like the room is spinning. Get help right away if:  You throw up (vomit) or have watery poop (diarrhea), and you cannot eat or drink anything.  You have trouble: ? Talking. ? Walking. ? Swallowing. ? Using your arms, hands, or legs.  You feel generally weak.  You are not thinking clearly, or you have trouble forming sentences. A friend or family member may notice this.  You have: ? Chest pain. ? Pain in your belly  (abdomen). ? Shortness of breath. ? Sweating.  Your vision changes.  You are bleeding.  You have a very bad headache.  You have neck pain or a stiff neck.  You have a fever. These symptoms may be an emergency. Do not wait to see if the symptoms will go away. Get medical help right away. Call your local emergency services (911 in the [place] Do not drive yourself to the hospital. Summary  Dizziness makes you feel unsteady or light-headed. You may feel like you are about to pass out (faint).  Drink enough fluid to keep your pee (urine) clear or pale yellow. Do not drink alcohol.  Avoid making quick movements if you feel dizzy.  Watch your dizziness for any changes. This information is not intended to replace advice given to you by your health care provider. Make sure you discuss any questions you have with your health care provider. Document Revised: 05/04/2017 Document Reviewed: 05/18/2016 Elsevier Patient Education  [place].  Dehydration, Adult Dehydration is condition in which there is not enough water or other fluids in the body. This happens when a person loses more fluids than he or she takes in. Important body parts cannot work right without the right amount of fluids. Any loss of fluids from the body can cause dehydration. Dehydration can be mild, worse, or very bad. It should be treated right away to keep it from getting very bad. What are the causes? This condition may be caused by:  Conditions that cause loss of water or other fluids, such as: ? Watery poop (diarrhea). ? Vomiting. ? Sweating a lot. ? Peeing (urinating) a lot.  Not drinking enough fluids, especially when you: ? Are ill. ? Are doing things that take a lot of energy to do.  Other illnesses and conditions, such as fever or infection.  Certain medicines, such as medicines that take extra fluid out of the body (diuretics).  Lack of safe drinking water.  Not being able to get enough water  and food. What increases the risk? The following factors may make you more likely to develop this condition:  Having a long-term (chronic) illness that has not been treated the right way, such as: ? Diabetes. ? Heart disease. ? Kidney disease.  Being 32 years of age or older.  Having a disability.  Living in a place that is high above the ground or sea (high in altitude). The thinner, dried air causes more fluid loss.  Doing exercises that put stress on your body for a long time. What are the signs or symptoms? Symptoms of dehydration depend on how bad it is. Mild or worse dehydration  Thirst.  Dry lips or dry mouth.  Feeling dizzy or light-headed, especially when you stand up from sitting.  Muscle cramps.  Your body making: ? Dark pee (urine). Pee may be the color of tea. ? Less pee than normal. ? Less tears than normal.  Headache. Very bad dehydration  Changes in skin. Skin may: ? Be cold to the touch (clammy). ? Be blotchy or pale. ? Not go back to normal right after you lightly pinch it and let it go.  Little or no tears, pee, or sweat.  Changes in vital signs, such as: ? Fast breathing. ? Low blood pressure. ? Weak pulse. ? Pulse that is more than 100 beats a minute when you are sitting still.  Other changes, such as: ? Feeling very thirsty. ? Eyes that look hollow (sunken). ? Cold hands and feet. ? Being mixed up (confused). ? Being very tired (lethargic) or having trouble waking from sleep. ? Short-term weight loss. ? Loss of consciousness. How is this treated? Treatment for this condition depends on how bad it is. Treatment should start right away. Do not wait until your condition gets very bad. Very bad dehydration is an emergency. You will need to go to a hospital.  Mild or worse dehydration can be treated at home. You may be asked to: ? Drink more fluids. ? Drink an oral rehydration solution (ORS). This  drink helps get the right amounts of  fluids and salts and minerals in the blood (electrolytes).  Very bad dehydration can be treated: ? With fluids through an IV tube. ? By getting normal levels of salts and minerals in your blood. This is often done by giving salts and minerals through a tube. The tube is passed through your nose and into your stomach. ? By treating the root cause. Follow these instructions at home: Oral rehydration solution If told by your doctor, drink an ORS:  Make an ORS. Use instructions on the package.  Start by drinking small amounts, about  cup (120 mL) every 5-10 minutes.  Slowly drink more until you have had the amount that your doctor said to have. Eating and drinking         Drink enough clear fluid to keep your pee pale yellow. If you were told to drink an ORS, finish the ORS first. Then, start slowly drinking other clear fluids. Drink fluids such as: ? Water. Do not drink only water. Doing that can make the salt (sodium) level in your body get too low. ? Water from ice chips you suck on. ? Fruit juice that you have added water to (diluted). ? Low-calorie sports drinks.  Eat foods that have the right amounts of salts and minerals, such as: ? Bananas. ? Oranges. ? Potatoes. ? Tomatoes. ? Spinach.  Do not drink alcohol.  Avoid: ? Drinks that have a lot of sugar. These include:  High-calorie sports drinks.  Fruit juice that you did not add water to.  Soda.  Caffeine. ? Foods that are greasy or have a lot of fat or sugar. General instructions  Take over-the-counter and prescription medicines only as told by your doctor.  Do not take salt tablets. Doing that can make the salt level in your body get too high.  Return to your normal activities as told by your doctor. Ask your doctor what activities are safe for you.  Keep all follow-up visits as told by your doctor. This is important. Contact a doctor if:  You have pain in your belly (abdomen) and the pain: ? Gets  worse. ? Stays in one place.  You have a rash.  You have a stiff neck.  You get angry or annoyed (irritable) more easily than normal.  You are more tired or have a harder time waking than normal.  You feel: ? Weak or dizzy. ? Very thirsty. Get help right away if you have:  Any symptoms of very bad dehydration.  Symptoms of vomiting, such as: ? You cannot eat or drink without vomiting. ? Your vomiting gets worse or does not go away. ? Your vomit has blood or green stuff in it.  Symptoms that get worse with treatment.  A fever.  A very bad headache.  Problems with peeing or pooping (having a bowel movement), such as: ? Watery poop that gets worse or does not go away. ? Blood in your poop (stool). This may cause poop to look black and tarry. ? Not peeing in 6-8 hours. ? Peeing only a small amount of very dark pee in 6-8 hours.  Trouble breathing. These symptoms may be an emergency. Do not wait to see if the symptoms will go away. Get medical help right away. Call your local emergency services (911 in the [place] Do not drive yourself to the hospital. Summary  Dehydration is a condition in which there is not enough water or other fluids in the  body. This happens when a person loses more fluids than he or she takes in.  Treatment for this condition depends on how bad it is. Treatment should be started right away. Do not wait until your condition gets very bad.  Drink enough clear fluid to keep your pee pale yellow. If you were told to drink an oral rehydration solution (ORS), finish the ORS first. Then, start slowly drinking other clear fluids.  Take over-the-counter and prescription medicines only as told by your doctor.  Get help right away if you have any symptoms of very bad dehydration. This information is not intended to replace advice given to you by your health care provider. Make sure you discuss any questions you have with your health care provider. Document  Revised: 12/12/2018 Document Reviewed: 12/12/2018 Elsevier Patient Education  [place].
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
## 2019-07-16 NOTE — MAU Note (Signed)
.   Ashley Jensen is a 34 y.o. at [redacted]w[redacted]d here in MAU reporting: that she had a fall and hit her head and was evaluated in MAU on Saturday . Reports she has felt fine until today but has noticed dizziness and nausea today   Onset of complaint: fall on Saturday Pain score: 0 Vitals:   07/16/19 1903  BP: 134/90  Pulse: 76  Resp: 16  Temp: 98.2 F (36.8 C)  SpO2: 99%     FHT:154 Lab orders placed from triage: UA

## 2019-07-24 DIAGNOSIS — M9902 Segmental and somatic dysfunction of thoracic region: Secondary | ICD-10-CM | POA: Diagnosis not present

## 2019-07-24 DIAGNOSIS — M530 Cervicocranial syndrome: Secondary | ICD-10-CM | POA: Diagnosis not present

## 2019-07-24 DIAGNOSIS — M9901 Segmental and somatic dysfunction of cervical region: Secondary | ICD-10-CM | POA: Diagnosis not present

## 2019-07-24 DIAGNOSIS — M9903 Segmental and somatic dysfunction of lumbar region: Secondary | ICD-10-CM | POA: Diagnosis not present

## 2019-08-06 DIAGNOSIS — M9903 Segmental and somatic dysfunction of lumbar region: Secondary | ICD-10-CM | POA: Diagnosis not present

## 2019-08-06 DIAGNOSIS — M9902 Segmental and somatic dysfunction of thoracic region: Secondary | ICD-10-CM | POA: Diagnosis not present

## 2019-08-06 DIAGNOSIS — M9901 Segmental and somatic dysfunction of cervical region: Secondary | ICD-10-CM | POA: Diagnosis not present

## 2019-08-06 DIAGNOSIS — M530 Cervicocranial syndrome: Secondary | ICD-10-CM | POA: Diagnosis not present

## 2019-08-12 DIAGNOSIS — Z0184 Encounter for antibody response examination: Secondary | ICD-10-CM | POA: Diagnosis not present

## 2019-09-09 DIAGNOSIS — Z361 Encounter for antenatal screening for raised alphafetoprotein level: Secondary | ICD-10-CM | POA: Diagnosis not present

## 2019-09-09 DIAGNOSIS — Z3480 Encounter for supervision of other normal pregnancy, unspecified trimester: Secondary | ICD-10-CM | POA: Diagnosis not present

## 2019-09-09 DIAGNOSIS — Z23 Encounter for immunization: Secondary | ICD-10-CM | POA: Diagnosis not present

## 2019-09-24 DIAGNOSIS — N760 Acute vaginitis: Secondary | ICD-10-CM | POA: Diagnosis not present

## 2019-09-25 DIAGNOSIS — O99891 Other specified diseases and conditions complicating pregnancy: Secondary | ICD-10-CM | POA: Diagnosis not present

## 2019-09-25 DIAGNOSIS — O219 Vomiting of pregnancy, unspecified: Secondary | ICD-10-CM | POA: Diagnosis not present

## 2019-09-25 DIAGNOSIS — Z3A29 29 weeks gestation of pregnancy: Secondary | ICD-10-CM | POA: Diagnosis not present

## 2019-09-25 DIAGNOSIS — O26893 Other specified pregnancy related conditions, third trimester: Secondary | ICD-10-CM | POA: Diagnosis not present

## 2019-09-25 DIAGNOSIS — R197 Diarrhea, unspecified: Secondary | ICD-10-CM | POA: Diagnosis not present

## 2019-10-02 DIAGNOSIS — M9901 Segmental and somatic dysfunction of cervical region: Secondary | ICD-10-CM | POA: Diagnosis not present

## 2019-10-02 DIAGNOSIS — M9903 Segmental and somatic dysfunction of lumbar region: Secondary | ICD-10-CM | POA: Diagnosis not present

## 2019-10-02 DIAGNOSIS — M9902 Segmental and somatic dysfunction of thoracic region: Secondary | ICD-10-CM | POA: Diagnosis not present

## 2019-10-02 DIAGNOSIS — M530 Cervicocranial syndrome: Secondary | ICD-10-CM | POA: Diagnosis not present

## 2019-10-09 DIAGNOSIS — Z3A31 31 weeks gestation of pregnancy: Secondary | ICD-10-CM | POA: Diagnosis not present

## 2019-10-09 DIAGNOSIS — Z362 Encounter for other antenatal screening follow-up: Secondary | ICD-10-CM | POA: Diagnosis not present

## 2019-10-20 DIAGNOSIS — M9903 Segmental and somatic dysfunction of lumbar region: Secondary | ICD-10-CM | POA: Diagnosis not present

## 2019-10-20 DIAGNOSIS — M530 Cervicocranial syndrome: Secondary | ICD-10-CM | POA: Diagnosis not present

## 2019-10-20 DIAGNOSIS — M9901 Segmental and somatic dysfunction of cervical region: Secondary | ICD-10-CM | POA: Diagnosis not present

## 2019-10-20 DIAGNOSIS — M9902 Segmental and somatic dysfunction of thoracic region: Secondary | ICD-10-CM | POA: Diagnosis not present

## 2019-11-03 ENCOUNTER — Other Ambulatory Visit (HOSPITAL_COMMUNITY): Payer: Self-pay | Admitting: Obstetrics and Gynecology

## 2019-11-03 DIAGNOSIS — M79661 Pain in right lower leg: Secondary | ICD-10-CM

## 2019-11-04 ENCOUNTER — Ambulatory Visit (HOSPITAL_COMMUNITY)
Admission: RE | Admit: 2019-11-04 | Discharge: 2019-11-04 | Disposition: A | Discharge status: Home / Self Care | Payer: BC Managed Care – PPO | Source: Ambulatory Visit | Attending: Obstetrics and Gynecology | Admitting: Obstetrics and Gynecology

## 2019-11-04 ENCOUNTER — Other Ambulatory Visit: Payer: Self-pay

## 2019-11-04 DIAGNOSIS — M79662 Pain in left lower leg: Secondary | ICD-10-CM | POA: Diagnosis not present

## 2019-11-04 DIAGNOSIS — M79661 Pain in right lower leg: Secondary | ICD-10-CM | POA: Diagnosis not present

## 2019-11-04 NOTE — Progress Notes (Addendum)
Lower extremity venous has been completed.   Preliminary results in CV Proc.  Attempted to call results, no answer.   Blanch Media 11/04/2019 1:28 PM

## 2019-11-10 DIAGNOSIS — M9901 Segmental and somatic dysfunction of cervical region: Secondary | ICD-10-CM | POA: Diagnosis not present

## 2019-11-10 DIAGNOSIS — M530 Cervicocranial syndrome: Secondary | ICD-10-CM | POA: Diagnosis not present

## 2019-11-10 DIAGNOSIS — M9902 Segmental and somatic dysfunction of thoracic region: Secondary | ICD-10-CM | POA: Diagnosis not present

## 2019-11-10 DIAGNOSIS — M9903 Segmental and somatic dysfunction of lumbar region: Secondary | ICD-10-CM | POA: Diagnosis not present

## 2019-11-11 DIAGNOSIS — Z3480 Encounter for supervision of other normal pregnancy, unspecified trimester: Secondary | ICD-10-CM | POA: Diagnosis not present

## 2019-11-11 DIAGNOSIS — Z3685 Encounter for antenatal screening for Streptococcus B: Secondary | ICD-10-CM | POA: Diagnosis not present

## 2019-11-11 LAB — OB RESULTS CONSOLE GBS: GBS: NEGATIVE

## 2019-11-14 ENCOUNTER — Inpatient Hospital Stay (HOSPITAL_COMMUNITY)
Admission: AD | Admit: 2019-11-14 | Discharge: 2019-11-15 | Disposition: A | Discharge status: Home / Self Care | Payer: BC Managed Care – PPO | Attending: Obstetrics and Gynecology | Admitting: Obstetrics and Gynecology

## 2019-11-14 ENCOUNTER — Other Ambulatory Visit: Payer: Self-pay

## 2019-11-14 ENCOUNTER — Encounter (HOSPITAL_COMMUNITY): Payer: Self-pay | Admitting: Obstetrics and Gynecology

## 2019-11-14 DIAGNOSIS — M79604 Pain in right leg: Secondary | ICD-10-CM

## 2019-11-14 DIAGNOSIS — Z86718 Personal history of other venous thrombosis and embolism: Secondary | ICD-10-CM | POA: Diagnosis not present

## 2019-11-14 DIAGNOSIS — M5432 Sciatica, left side: Secondary | ICD-10-CM | POA: Diagnosis not present

## 2019-11-14 DIAGNOSIS — Z79899 Other long term (current) drug therapy: Secondary | ICD-10-CM | POA: Diagnosis not present

## 2019-11-14 DIAGNOSIS — O26893 Other specified pregnancy related conditions, third trimester: Secondary | ICD-10-CM

## 2019-11-14 DIAGNOSIS — R42 Dizziness and giddiness: Secondary | ICD-10-CM | POA: Insufficient documentation

## 2019-11-14 DIAGNOSIS — O99613 Diseases of the digestive system complicating pregnancy, third trimester: Secondary | ICD-10-CM | POA: Diagnosis not present

## 2019-11-14 DIAGNOSIS — Z3A36 36 weeks gestation of pregnancy: Secondary | ICD-10-CM | POA: Diagnosis not present

## 2019-11-14 DIAGNOSIS — K219 Gastro-esophageal reflux disease without esophagitis: Secondary | ICD-10-CM | POA: Diagnosis not present

## 2019-11-14 DIAGNOSIS — O09523 Supervision of elderly multigravida, third trimester: Secondary | ICD-10-CM | POA: Diagnosis not present

## 2019-11-14 DIAGNOSIS — Z3689 Encounter for other specified antenatal screening: Secondary | ICD-10-CM

## 2019-11-14 LAB — CBC
HCT: 35.7 % — ABNORMAL LOW (ref 36.0–46.0)
Hemoglobin: 12 g/dL (ref 12.0–15.0)
MCH: 29.4 pg (ref 26.0–34.0)
MCHC: 33.6 g/dL (ref 30.0–36.0)
MCV: 87.5 fL (ref 80.0–100.0)
Platelets: 218 10*3/uL (ref 150–400)
RBC: 4.08 MIL/uL (ref 3.87–5.11)
RDW: 14 % (ref 11.5–15.5)
WBC: 15.7 10*3/uL — ABNORMAL HIGH (ref 4.0–10.5)
nRBC: 0 % (ref 0.0–0.2)

## 2019-11-14 LAB — URINALYSIS, ROUTINE W REFLEX MICROSCOPIC
Bilirubin Urine: NEGATIVE
Glucose, UA: NEGATIVE mg/dL
Hgb urine dipstick: NEGATIVE
Ketones, ur: NEGATIVE mg/dL
Leukocytes,Ua: NEGATIVE
Nitrite: NEGATIVE
Protein, ur: 30 mg/dL — AB
Specific Gravity, Urine: 1.028 (ref 1.005–1.030)
pH: 5 (ref 5.0–8.0)

## 2019-11-14 LAB — BASIC METABOLIC PANEL
Anion gap: 9 (ref 5–15)
BUN: 5 mg/dL — ABNORMAL LOW (ref 6–20)
CO2: 20 mmol/L — ABNORMAL LOW (ref 22–32)
Calcium: 8.9 mg/dL (ref 8.9–10.3)
Chloride: 106 mmol/L (ref 98–111)
Creatinine, Ser: 0.62 mg/dL (ref 0.44–1.00)
GFR calc Af Amer: >60 mL/min (ref >60)
GFR calc non Af Amer: >60 mL/min (ref >60)
Glucose, Bld: 112 mg/dL — ABNORMAL HIGH (ref 70–99)
Potassium: 3.8 mmol/L (ref 3.5–5.1)
Sodium: 135 mmol/L (ref 135–145)

## 2019-11-14 MED ORDER — CYCLOBENZAPRINE HCL 10 MG PO TABS: 10.0000 mg | ORAL_TABLET | Freq: Three times a day (TID) | ORAL | 0 refills | Status: DC | PRN

## 2019-11-14 MED ORDER — CYCLOBENZAPRINE HCL 5 MG PO TABS
10.0000 mg | ORAL_TABLET | Freq: Once | ORAL | Status: AC
  Administered 2019-11-14: 10 mg via ORAL
  Filled 2019-11-14: qty 2

## 2019-11-14 NOTE — Discharge Instructions (Signed)
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
## 2019-11-14 NOTE — MAU Note (Signed)
Pt here stating that "right leg has been giving out". Upper leg goes numb and then gets a sharp pain, without warning. Denies redness, warmth or tenderness to touch. Had an ultrasound last week to r/o DVT. Nothing was seen on the ultrasound. No history of blood clot but has history of circulation issues. Pt also reports that she has felt intermittently dizzy since yesterday. Has had to have multiple IVs with this pregnancy. Also reports she lost mucous plug today. She denies LOF or vaginal bleeding. Some braxton hicks contractions. Reports good fetal movement.

## 2019-11-15 DIAGNOSIS — O26893 Other specified pregnancy related conditions, third trimester: Secondary | ICD-10-CM | POA: Diagnosis not present

## 2019-11-18 DIAGNOSIS — Z3480 Encounter for supervision of other normal pregnancy, unspecified trimester: Secondary | ICD-10-CM | POA: Diagnosis not present

## 2019-11-18 DIAGNOSIS — Z3490 Encounter for supervision of normal pregnancy, unspecified, unspecified trimester: Secondary | ICD-10-CM | POA: Diagnosis not present

## 2019-11-21 ENCOUNTER — Telehealth (HOSPITAL_COMMUNITY): Payer: Self-pay | Admitting: *Deleted

## 2019-11-21 DIAGNOSIS — M9903 Segmental and somatic dysfunction of lumbar region: Secondary | ICD-10-CM | POA: Diagnosis not present

## 2019-11-21 DIAGNOSIS — M9901 Segmental and somatic dysfunction of cervical region: Secondary | ICD-10-CM | POA: Diagnosis not present

## 2019-11-21 DIAGNOSIS — M9902 Segmental and somatic dysfunction of thoracic region: Secondary | ICD-10-CM | POA: Diagnosis not present

## 2019-11-21 DIAGNOSIS — M530 Cervicocranial syndrome: Secondary | ICD-10-CM | POA: Diagnosis not present

## 2019-11-21 NOTE — Telephone Encounter (Signed)
Preadmission screen  

## 2019-11-24 ENCOUNTER — Encounter (HOSPITAL_COMMUNITY): Payer: Self-pay | Admitting: *Deleted

## 2019-12-04 ENCOUNTER — Encounter (HOSPITAL_COMMUNITY): Payer: Self-pay | Admitting: Obstetrics and Gynecology
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
## 2019-12-06 ENCOUNTER — Other Ambulatory Visit (HOSPITAL_COMMUNITY)
Admission: RE | Admit: 2019-12-06 | Discharge: 2019-12-06 | Disposition: A | Discharge status: Home / Self Care | Payer: BC Managed Care – PPO | Source: Ambulatory Visit | Attending: Obstetrics and Gynecology | Admitting: Obstetrics and Gynecology

## 2019-12-06 ENCOUNTER — Other Ambulatory Visit: Payer: Self-pay

## 2019-12-06 ENCOUNTER — Inpatient Hospital Stay (EMERGENCY_DEPARTMENT_HOSPITAL)
Admission: AD | Admit: 2019-12-06 | Discharge: 2019-12-06 | Disposition: A | Discharge status: Home / Self Care | Payer: BC Managed Care – PPO | Source: Ambulatory Visit | Attending: Obstetrics and Gynecology | Admitting: Obstetrics and Gynecology

## 2019-12-06 DIAGNOSIS — Z3689 Encounter for other specified antenatal screening: Secondary | ICD-10-CM

## 2019-12-06 DIAGNOSIS — Z91040 Latex allergy status: Secondary | ICD-10-CM | POA: Diagnosis not present

## 2019-12-06 DIAGNOSIS — E669 Obesity, unspecified: Secondary | ICD-10-CM | POA: Diagnosis not present

## 2019-12-06 DIAGNOSIS — Z20822 Contact with and (suspected) exposure to covid-19: Secondary | ICD-10-CM | POA: Diagnosis not present

## 2019-12-06 DIAGNOSIS — O99214 Obesity complicating childbirth: Secondary | ICD-10-CM | POA: Diagnosis not present

## 2019-12-06 DIAGNOSIS — O26893 Other specified pregnancy related conditions, third trimester: Secondary | ICD-10-CM | POA: Diagnosis not present

## 2019-12-06 DIAGNOSIS — O9902 Anemia complicating childbirth: Secondary | ICD-10-CM | POA: Diagnosis not present

## 2019-12-06 DIAGNOSIS — O471 False labor at or after 37 completed weeks of gestation: Secondary | ICD-10-CM | POA: Diagnosis not present

## 2019-12-06 DIAGNOSIS — Z3A39 39 weeks gestation of pregnancy: Secondary | ICD-10-CM | POA: Diagnosis not present

## 2019-12-06 DIAGNOSIS — D649 Anemia, unspecified: Secondary | ICD-10-CM | POA: Diagnosis not present

## 2019-12-06 LAB — SARS CORONAVIRUS 2 (TAT 6-24 HRS): SARS Coronavirus 2: NEGATIVE

## 2019-12-06 NOTE — MAU Provider Note (Signed)
S: Ms. Adelfa [place] is a 34 y.o. G3P0020 at [redacted]w[redacted]d  who presents to MAU today for labor evaluation.   Nurse reports patient with only one contraction and cervical exam as below.  Requests discharge.  Cervical exam by RN:  Dilation: 1.5 Effacement (%): 50 Cervical Position: Posterior Station: -3 Presentation: Vertex Exam by:: K.Wilson,RN  Fetal Monitoring: Baseline: 120  Variability: Moderate Accelerations: Present Decelerations: None Contractions: None graphed, but nurse reports one palpated-mild  MDM Discussed patient with RN. NST reviewed.   A: SIUP at [redacted]w[redacted]d  False labor  P: Discharge home Labor precautions and kick counts included in AVS Patient to follow-up with primary ob as scheduled  Patient may return to MAU as needed or when in labor   Gerrit Heck, [place] 12/06/2019 9:38 PM

## 2019-12-06 NOTE — MAU Note (Signed)
I have communicated with J>emly,CNM and reviewed vital signs:  Vitals:   12/06/19 2055 12/06/19 2155  BP: 119/83 112/79  Pulse: 70   Resp: 18 18  Temp: 98.3 F (36.8 C)   SpO2: 99%     Vaginal exam:  Dilation: 1.5 Effacement (%): 50 Cervical Position: Posterior Station: -3 Presentation: Vertex Exam by:: K.Aaliyana Fredericks,RN,   Also reviewed contraction pattern and that non-stress test is reactive.  It has been documented that patient is not having ctx anymore contracting e with no  cervical change from office visit not indicating active labor.  Patient denies any other complaints.  Based on this report provider has given order for discharge.  A discharge order and diagnosis entered by a provider.   Labor discharge instructions reviewed with patient.

## 2019-12-06 NOTE — MAU Note (Signed)
Ashley Jensen is a 34 y.o. at [redacted]w[redacted]d here in MAU reporting: contractions that started at 6:45pm  Pain score: 8 Vitals:   12/06/19 2055  BP: 119/83  Pulse: 70  Resp: 18  Temp: 98.3 F (36.8 C)  SpO2: 99%     FHT:127

## 2019-12-08 ENCOUNTER — Other Ambulatory Visit: Payer: Self-pay

## 2019-12-08 ENCOUNTER — Encounter (HOSPITAL_COMMUNITY): Payer: Self-pay | Admitting: Obstetrics and Gynecology

## 2019-12-08 ENCOUNTER — Inpatient Hospital Stay (HOSPITAL_COMMUNITY): Payer: BC Managed Care – PPO | Admitting: Anesthesiology

## 2019-12-08 ENCOUNTER — Inpatient Hospital Stay (HOSPITAL_COMMUNITY): Payer: BC Managed Care – PPO

## 2019-12-08 ENCOUNTER — Inpatient Hospital Stay (HOSPITAL_COMMUNITY)
Admission: AD | Admit: 2019-12-08 | Discharge: 2019-12-10 | DRG: 807 | Disposition: A | Discharge status: Home / Self Care | Payer: BC Managed Care – PPO | Attending: Obstetrics and Gynecology | Admitting: Obstetrics and Gynecology

## 2019-12-08 DIAGNOSIS — Z91040 Latex allergy status: Secondary | ICD-10-CM | POA: Diagnosis not present

## 2019-12-08 DIAGNOSIS — Z3A39 39 weeks gestation of pregnancy: Secondary | ICD-10-CM

## 2019-12-08 DIAGNOSIS — E669 Obesity, unspecified: Secondary | ICD-10-CM | POA: Diagnosis present

## 2019-12-08 DIAGNOSIS — Z20822 Contact with and (suspected) exposure to covid-19: Secondary | ICD-10-CM | POA: Diagnosis present

## 2019-12-08 DIAGNOSIS — O99214 Obesity complicating childbirth: Secondary | ICD-10-CM | POA: Diagnosis present

## 2019-12-08 DIAGNOSIS — Z3490 Encounter for supervision of normal pregnancy, unspecified, unspecified trimester: Secondary | ICD-10-CM

## 2019-12-08 DIAGNOSIS — O26893 Other specified pregnancy related conditions, third trimester: Secondary | ICD-10-CM | POA: Diagnosis present

## 2019-12-08 LAB — CBC
HCT: 35.7 % — ABNORMAL LOW (ref 36.0–46.0)
Hemoglobin: 12.3 g/dL (ref 12.0–15.0)
MCH: 30.1 pg (ref 26.0–34.0)
MCHC: 34.5 g/dL (ref 30.0–36.0)
MCV: 87.5 fL (ref 80.0–100.0)
Platelets: 240 10*3/uL (ref 150–400)
RBC: 4.08 MIL/uL (ref 3.87–5.11)
RDW: 14 % (ref 11.5–15.5)
WBC: 13.6 10*3/uL — ABNORMAL HIGH (ref 4.0–10.5)
nRBC: 0 % (ref 0.0–0.2)

## 2019-12-08 LAB — PREPARE RBC (CROSSMATCH)

## 2019-12-08 LAB — RPR
RPR Ser Ql: REACTIVE — AB
RPR Titer: 1:2 {titer}

## 2019-12-08 MED ORDER — EPHEDRINE 5 MG/ML INJ: 10.0000 mg | INTRAVENOUS | Status: DC | PRN

## 2019-12-08 MED ORDER — SENNOSIDES-DOCUSATE SODIUM 8.6-50 MG PO TABS
2.0000 | ORAL_TABLET | ORAL | Status: DC
  Administered 2019-12-09 (×2): 2 via ORAL
  Filled 2019-12-08 (×2): qty 2

## 2019-12-08 MED ORDER — OXYTOCIN BOLUS FROM INFUSION
333.0000 mL | Freq: Once | INTRAVENOUS | Status: AC
  Administered 2019-12-08: 333 mL via INTRAVENOUS

## 2019-12-08 MED ORDER — DIPHENHYDRAMINE HCL 50 MG/ML IJ SOLN: 12.5000 mg | INTRAMUSCULAR | Status: DC | PRN

## 2019-12-08 MED ORDER — HYDROXYZINE HCL 50 MG PO TABS: 50.0000 mg | ORAL_TABLET | Freq: Four times a day (QID) | ORAL | Status: DC | PRN

## 2019-12-08 MED ORDER — MISOPROSTOL 25 MCG QUARTER TABLET
25.0000 ug | ORAL_TABLET | ORAL | Status: DC | PRN
  Administered 2019-12-08: 25 ug via VAGINAL
  Filled 2019-12-08 (×2): qty 1

## 2019-12-08 MED ORDER — SOD CITRATE-CITRIC ACID 500-334 MG/5ML PO SOLN: 30.0000 mL | ORAL | Status: DC | PRN

## 2019-12-08 MED ORDER — SODIUM CHLORIDE (PF) 0.9 % IJ SOLN
INTRAMUSCULAR | Status: DC | PRN
  Administered 2019-12-08: 12 mL/h via EPIDURAL

## 2019-12-08 MED ORDER — LIDOCAINE HCL (PF) 1 % IJ SOLN
INTRAMUSCULAR | Status: DC | PRN
  Administered 2019-12-08 (×2): 4 mL via EPIDURAL

## 2019-12-08 MED ORDER — OXYCODONE-ACETAMINOPHEN 5-325 MG PO TABS: 1.0000 | ORAL_TABLET | ORAL | Status: DC | PRN

## 2019-12-08 MED ORDER — WITCH HAZEL-GLYCERIN EX PADS
1.0000 "application " | MEDICATED_PAD | CUTANEOUS | Status: DC | PRN
  Administered 2019-12-09: 1 via TOPICAL

## 2019-12-08 MED ORDER — ONDANSETRON HCL 4 MG/2ML IJ SOLN: 4.0000 mg | INTRAMUSCULAR | Status: DC | PRN

## 2019-12-08 MED ORDER — OXYCODONE HCL 5 MG PO TABS
5.0000 mg | ORAL_TABLET | ORAL | Status: DC | PRN
  Administered 2019-12-09 (×2): 5 mg via ORAL
  Filled 2019-12-08 (×2): qty 1

## 2019-12-08 MED ORDER — PHENYLEPHRINE 40 MCG/ML (10ML) SYRINGE FOR IV PUSH (FOR BLOOD PRESSURE SUPPORT): 80.0000 ug | PREFILLED_SYRINGE | INTRAVENOUS | Status: DC | PRN

## 2019-12-08 MED ORDER — OXYCODONE-ACETAMINOPHEN 5-325 MG PO TABS: 2.0000 | ORAL_TABLET | ORAL | Status: DC | PRN

## 2019-12-08 MED ORDER — OXYCODONE HCL 5 MG PO TABS
10.0000 mg | ORAL_TABLET | ORAL | Status: DC | PRN
  Administered 2019-12-09 – 2019-12-10 (×4): 10 mg via ORAL
  Filled 2019-12-08 (×4): qty 2

## 2019-12-08 MED ORDER — ACETAMINOPHEN 325 MG PO TABS
650.0000 mg | ORAL_TABLET | ORAL | Status: DC | PRN
  Administered 2019-12-08: 650 mg via ORAL
  Filled 2019-12-08: qty 2

## 2019-12-08 MED ORDER — ACETAMINOPHEN 325 MG PO TABS
650.0000 mg | ORAL_TABLET | ORAL | Status: DC | PRN
  Administered 2019-12-08 – 2019-12-10 (×5): 650 mg via ORAL
  Filled 2019-12-08 (×5): qty 2

## 2019-12-08 MED ORDER — SIMETHICONE 80 MG PO CHEW: 80.0000 mg | CHEWABLE_TABLET | ORAL | Status: DC | PRN

## 2019-12-08 MED ORDER — OXYTOCIN-SODIUM CHLORIDE 30-0.9 UT/500ML-% IV SOLN: 2.5000 [IU]/h | INTRAVENOUS | Status: DC

## 2019-12-08 MED ORDER — LORATADINE 10 MG PO TABS
10.0000 mg | ORAL_TABLET | Freq: Every day | ORAL | Status: DC
  Administered 2019-12-09: 10 mg via ORAL
  Filled 2019-12-08: qty 1

## 2019-12-08 MED ORDER — LIDOCAINE HCL (PF) 1 % IJ SOLN: 30.0000 mL | INTRAMUSCULAR | Status: DC | PRN

## 2019-12-08 MED ORDER — LACTATED RINGERS IV SOLN: 500.0000 mL | Freq: Once | INTRAVENOUS | Status: DC

## 2019-12-08 MED ORDER — DIPHENHYDRAMINE HCL 25 MG PO CAPS: 25.0000 mg | ORAL_CAPSULE | Freq: Four times a day (QID) | ORAL | Status: DC | PRN

## 2019-12-08 MED ORDER — OXYTOCIN-SODIUM CHLORIDE 30-0.9 UT/500ML-% IV SOLN
1.0000 m[IU]/min | INTRAVENOUS | Status: DC
  Administered 2019-12-08: 2 m[IU]/min via INTRAVENOUS
  Filled 2019-12-08: qty 500

## 2019-12-08 MED ORDER — ONDANSETRON HCL 4 MG/2ML IJ SOLN
4.0000 mg | Freq: Four times a day (QID) | INTRAMUSCULAR | Status: DC | PRN
  Administered 2019-12-08: 4 mg via INTRAVENOUS
  Filled 2019-12-08: qty 2

## 2019-12-08 MED ORDER — TETANUS-DIPHTH-ACELL PERTUSSIS 5-2.5-18.5 LF-MCG/0.5 IM SUSP: 0.5000 mL | Freq: Once | INTRAMUSCULAR | Status: DC

## 2019-12-08 MED ORDER — COCONUT OIL OIL: 1.0000 "application " | TOPICAL_OIL | Status: DC | PRN

## 2019-12-08 MED ORDER — LACTATED RINGERS IV SOLN: INTRAVENOUS | Status: DC

## 2019-12-08 MED ORDER — PRENATAL MULTIVITAMIN CH
1.0000 | ORAL_TABLET | Freq: Every day | ORAL | Status: DC
  Administered 2019-12-09: 1 via ORAL
  Filled 2019-12-08: qty 1

## 2019-12-08 MED ORDER — ONDANSETRON HCL 4 MG PO TABS
4.0000 mg | ORAL_TABLET | ORAL | Status: DC | PRN
  Administered 2019-12-09 – 2019-12-10 (×4): 4 mg via ORAL
  Filled 2019-12-08 (×4): qty 1

## 2019-12-08 MED ORDER — BENZOCAINE-MENTHOL 20-0.5 % EX AERO
1.0000 "application " | INHALATION_SPRAY | CUTANEOUS | Status: DC | PRN
  Administered 2019-12-08 – 2019-12-09 (×2): 1 via TOPICAL
  Filled 2019-12-08 (×3): qty 56

## 2019-12-08 MED ORDER — IBUPROFEN 600 MG PO TABS
600.0000 mg | ORAL_TABLET | Freq: Four times a day (QID) | ORAL | Status: DC
  Administered 2019-12-08 – 2019-12-10 (×6): 600 mg via ORAL
  Filled 2019-12-08 (×6): qty 1

## 2019-12-08 MED ORDER — ZOLPIDEM TARTRATE 5 MG PO TABS
5.0000 mg | ORAL_TABLET | Freq: Every evening | ORAL | Status: DC | PRN
  Administered 2019-12-09: 5 mg via ORAL
  Filled 2019-12-08: qty 1

## 2019-12-08 MED ORDER — ZOLPIDEM TARTRATE 5 MG PO TABS
5.0000 mg | ORAL_TABLET | Freq: Every evening | ORAL | Status: DC | PRN
  Administered 2019-12-08: 5 mg via ORAL
  Filled 2019-12-08: qty 1

## 2019-12-08 MED ORDER — LACTATED RINGERS IV SOLN: 500.0000 mL | INTRAVENOUS | Status: DC | PRN

## 2019-12-08 MED ORDER — FENTANYL-BUPIVACAINE-NACL 0.5-0.125-0.9 MG/250ML-% EP SOLN
12.0000 mL/h | EPIDURAL | Status: DC | PRN
  Filled 2019-12-08: qty 250

## 2019-12-08 MED ORDER — TERBUTALINE SULFATE 1 MG/ML IJ SOLN
0.2500 mg | Freq: Once | INTRAMUSCULAR | Status: AC | PRN
  Administered 2019-12-08: 0.25 mg via SUBCUTANEOUS
  Filled 2019-12-08: qty 1

## 2019-12-08 MED ORDER — DIBUCAINE (PERIANAL) 1 % EX OINT
1.0000 "application " | TOPICAL_OINTMENT | CUTANEOUS | Status: DC | PRN
  Administered 2019-12-09: 1 via RECTAL
  Filled 2019-12-08: qty 28

## 2019-12-08 MED ORDER — BUTORPHANOL TARTRATE 1 MG/ML IJ SOLN: 1.0000 mg | INTRAMUSCULAR | Status: DC | PRN

## 2019-12-08 NOTE — Anesthesia Procedure Notes (Signed)

## 2019-12-08 NOTE — Progress Notes (Signed)
SVE c/c/+1 DOP Prolonged decel x 8 min Pitocin stopped Terbutaline given Now w/recovery over several contractions w/out further decels Will restart pitocin 1/2 dose and re-assess Will start to push when contracting regularly

## 2019-12-08 NOTE — Anesthesia Preprocedure Evaluation (Signed)
Anesthesia Evaluation  Patient identified by MRN, date of birth, ID band Patient awake    Reviewed: Allergy & Precautions, Patient's Chart, lab work & pertinent test results  Airway Mallampati: II  TM Distance: >3 FB Neck ROM: Full    Dental no notable dental hx. (+) Teeth Intact   Pulmonary pneumonia, resolved, Patient abstained from smoking.,    Pulmonary exam normal breath sounds clear to auscultation       Cardiovascular negative cardio ROS Normal cardiovascular exam+ Valvular Problems/Murmurs  Rhythm:Regular Rate:Normal     Neuro/Psych  Headaches, Anxiety    GI/Hepatic Neg liver ROS, GERD  Medicated and Controlled,  Endo/Other  Obesity  Renal/GU negative Renal ROS  negative genitourinary   Musculoskeletal negative musculoskeletal ROS (+)   Abdominal (+) + obese,   Peds  Hematology  (+) anemia ,   Anesthesia Other Findings   Reproductive/Obstetrics (+) Pregnancy                             Anesthesia Physical Anesthesia Plan  ASA: II  Anesthesia Plan: Epidural   Post-op Pain Management:    Induction:   PONV Risk Score and Plan:   Airway Management Planned: Natural Airway  Additional Equipment:   Intra-op Plan:   Post-operative Plan:   Informed Consent: I have reviewed the patients History and Physical, chart, labs and discussed the procedure including the risks, benefits and alternatives for the proposed anesthesia with the patient or authorized representative who has indicated his/her understanding and acceptance.       Plan Discussed with: Anesthesiologist  Anesthesia Plan Comments:         Anesthesia Quick Evaluation

## 2019-12-08 NOTE — Progress Notes (Signed)
Known hx rpr pos w/ neg conf testing rpr pos on admission, conf tes pending

## 2019-12-08 NOTE — Progress Notes (Signed)
No changes to H&P S/p cytotec x 1 Pitocin currently AROM clear fluid - SVE 3.5/80/-2 CLE now   Rosie Fate MD

## 2019-12-09 LAB — CBC
HCT: 30.1 % — ABNORMAL LOW (ref 36.0–46.0)
Hemoglobin: 10 g/dL — ABNORMAL LOW (ref 12.0–15.0)
MCH: 29.2 pg (ref 26.0–34.0)
MCHC: 33.2 g/dL (ref 30.0–36.0)
MCV: 88 fL (ref 80.0–100.0)
Platelets: 211 10*3/uL (ref 150–400)
RBC: 3.42 MIL/uL — ABNORMAL LOW (ref 3.87–5.11)
RDW: 14.3 % (ref 11.5–15.5)
WBC: 18.5 10*3/uL — ABNORMAL HIGH (ref 4.0–10.5)
nRBC: 0 % (ref 0.0–0.2)

## 2019-12-09 LAB — T.PALLIDUM AB, TOTAL: T Pallidum Abs: NONREACTIVE

## 2019-12-09 NOTE — Anesthesia Postprocedure Evaluation (Signed)
Anesthesia Post Note  Patient: Ashley Jensen  Procedure(s) Performed: AN AD HOC LABOR EPIDURAL     Patient location during evaluation: Mother Baby Anesthesia Type: Epidural Level of consciousness: awake Pain management: satisfactory to patient Vital Signs Assessment: post-procedure vital signs reviewed and stable Respiratory status: spontaneous breathing Cardiovascular status: stable Anesthetic complications: no   No complications documented.  Last Vitals:  Vitals:   12/09/19 0230 12/09/19 0630  BP: (!) 121/90 111/77  Pulse: 65 78  Resp: 18 16  Temp: 36.8 C 36.7 C  SpO2: 100% 100%    Last Pain:  Vitals:   12/09/19 1111  TempSrc:   PainSc: 7    Pain Goal:                   [employer]

## 2019-12-09 NOTE — Lactation Note (Signed)
This note was copied from a baby's chart. Lactation Consultation Note  Patient Name: Ashley Jensen Today's Date: 12/09/2019  RN reported that mom is sleeping. RN to call when mom wakes up.   Maternal Data    Feeding Feeding Type: Breast Fed  LATCH Score Latch: Repeated attempts needed to sustain latch, nipple held in mouth throughout feeding, stimulation needed to elicit sucking reflex.  Audible Swallowing: None  Type of Nipple: Flat  Comfort (Breast/Nipple): Filling, red/small blisters or bruises, mild/mod discomfort  Hold (Positioning): Assistance needed to correctly position infant at breast and maintain latch.  LATCH Score: 4  Interventions Interventions: Breast feeding basics reviewed;Assisted with latch;Skin to skin;Hand express;Breast compression;Adjust position;Support pillows;Position options;Coconut oil  Lactation Tools Discussed/Used Tools: Pump;Coconut oil;Nipple Dorris Carnes;Shells Nipple shield size: 20 Shell Type: Inverted Breast pump type: Double-Electric Breast Pump Pump Review: Setup, frequency, and cleaning;Milk Storage Initiated by:: Barnabas Lister RN Date initiated:: 12/09/19   Consult Status      [name] [name] 12/09/2019, 1:25 PM

## 2019-12-09 NOTE — Progress Notes (Signed)
CSW received consult for history of anxiety.  CSW met with MOB to offer support and complete assessment.    MOB resting in bed holding infant with MOB's sister at bedside, when CSW entered the room. CSW introduced self and received verbal permission from MOB to complete assessment with MOB's sister present. CSW explained reason for consult to which MOB expressed understanding. MOB very pleasant and easy to engage throughout visit. CSW inquired about MOB's mental health history to which MOB acknowledged history of anxiety for the last 10 years but stated only formally diagnosed about 5 years ago. MOB acknowledged previously being on medications in the past but denied anything currently. MOB denied any recent symptoms or concerns and denied interest in medications or counseling at this time. CSW provided education regarding the baby blues period vs. perinatal mood disorders, discussed treatment and gave resources for mental health follow up if concerns arise. MOB denied any current SI or HI and denied any safety concerns in the home.   MOB confirmed having all essential items for infant once discharged and stated infant would be sleeping in a bassinet once home. CSW provided review of Sudden Infant Death Syndrome (SIDS) precautions and safe sleeping habits.    CSW identifies no further need for intervention and no barriers to discharge at this time.  Elijio Miles, LCSW [facility] [employer] [phone]

## 2019-12-09 NOTE — Progress Notes (Signed)
Post Partum Day 1 Subjective: no complaints, up ad lib, voiding and tolerating PO  Objective: Blood pressure 111/77, pulse 78, temperature 98 F (36.7 C), temperature source Oral, resp. rate 16, height 5\' 4"  (1.626 m), weight 87.2 kg, last menstrual period 10/28/2018, SpO2 100 %, unknown if currently breastfeeding.  Physical Exam:  General: alert, cooperative and appears stated age Lochia: appropriate Uterine Fundus: firm Incision: healing well, no significant drainage, no dehiscence DVT Evaluation: No evidence of DVT seen on physical exam. Negative Homan's sign. No cords or calf tenderness.  Recent Labs    12/08/19 0036 12/09/19 0507  HGB 12.3 10.0*  HCT 35.7* 30.1*    Assessment/Plan: Plan for discharge tomorrow  +RPR with FTABS pending   LOS: 1 day   12/11/19 12/09/2019, 8:32 AM

## 2019-12-09 NOTE — Lactation Note (Signed)
This note was copied from a baby's chart. Lactation Consultation Note  Patient Name: Girl Haisley [place] Today's Date: 12/09/2019  Baby girl  Gershon Cull [place] now 69 hours old.  Infant asleep on arrival.  Mom reports her nipples are sore has coconut oil.  Discussed hand expression and rubbing expressed mothers milk on nipples and then air dry prior to using the coconut oil.  Parents report read some breastfeeding information on line.  Mom reports she has a [employer]ra DEBP for home use. Asked mom if anyone had shown her  How to hand express mom reports she recently learned.  Discussed hand expression and spoon feeding past breastfeeding.  LC assist with hand expression. Dad gave Gershon Cull a few drops of colostrum from a spoon. Mom with visible bruising on areolas and reddened nipples.  Mom reports they are usually misshapen when they come out of Laken's mouth.  Laken stared cuing so assisted mom in cross cradle on the left breast.  Infant clamping/chompy.  Attempted to try to get her in a little deeper and closer  with a little chin tug and she started clamping more. LC unable to get mom  comfortable.  Broke suction and took her off and assisted with relatching her.  Same thing.  Broke suction again.  Assisted mom with breastfeeding in football position on right breast.  Mom wanted to give left breast a break.  LC assist with feeding on right.  Mom reports comfort. LC assisted with flanging upper lip.  Infant less chompy. Infant breastfed for 10 minutes and fell asleep.  Infant let go and had noteable lip blisters across her upper lip.  Mom reports she always has those when she comes off.  Urged mom to continue to check her nipple past breastfeeding.  Discussed pumping to stimulate supply.  Urged mom to pump with DEBP past breastfeeding if able for 15 minutes.  Urged mom to call lactation as needed.                  Interventions    Lactation Tools Discussed/Used     Consult Status      Jermaine Tholl Michaelle Copas 12/09/2019, 5:30 PM

## 2019-12-10 MED ORDER — OXYCODONE HCL 10 MG PO TABS: 10.0000 mg | ORAL_TABLET | ORAL | 0 refills | Status: DC | PRN

## 2019-12-10 MED ORDER — IBUPROFEN 600 MG PO TABS: 600.0000 mg | ORAL_TABLET | Freq: Four times a day (QID) | ORAL | 0 refills | Status: AC

## 2019-12-10 MED ORDER — ONDANSETRON HCL 4 MG PO TABS: 4.0000 mg | ORAL_TABLET | ORAL | 0 refills | Status: DC | PRN

## 2019-12-10 MED ORDER — SENNOSIDES-DOCUSATE SODIUM 8.6-50 MG PO TABS: 2.0000 | ORAL_TABLET | ORAL | 0 refills | Status: DC

## 2019-12-10 NOTE — Plan of Care (Signed)
Patient appropriate for discharge.

## 2019-12-10 NOTE — Discharge Summary (Signed)
Postpartum Discharge Summary  Date of Service December 10, 2019     Patient Name: Ashley Jensen DOB: 14-Oct-1985 MRN: [id number]  Date of admission: 12/08/2019 Delivery date:12/08/2019  Delivering provider: Ranae Pila  Date of discharge: 12/10/2019  Admitting diagnosis: Pregnancy [Z34.90] Intrauterine pregnancy: [redacted]w[redacted]d     Secondary diagnosis:  Active Problems:   Pregnancy  Additional problems: none    Discharge diagnosis: Term Pregnancy Delivered                                              Post partum procedures:none Augmentation: AROM, Pitocin and Cytotec Complications: None  Hospital course: Induction of Labor With Vaginal Delivery   34 y.o. yo [id number] at [redacted]w[redacted]d was admitted to the hospital 12/08/2019 for induction of labor.  Indication for induction: Favorable cervix at term.  Patient had an uncomplicated labor course as follows: Membrane Rupture Time/Date: 8:14 AM ,12/08/2019   Delivery Method:Vaginal, Vacuum (Extractor)  Episiotomy: None  Lacerations:  2nd degree;Vaginal  Details of delivery can be found in separate delivery note.  Patient had a routine postpartum course. Patient is discharged home 12/10/19.  Newborn Data: Birth date:12/08/2019  Birth time:3:49 PM  Gender:Female  Living status:Living  Apgars:5 ,9  Weight:3500 g   Magnesium Sulfate received: No BMZ received: No Rhophylac:No MMR:No T-DaP:Given prenatally Flu: No Transfusion:No  Physical exam  Vitals:   12/09/19 1615 12/09/19 2041 12/09/19 2103 12/10/19 0505  BP: 127/79 113/83 (!) 133/82 100/74  Pulse: 84 75 88 64  Resp: 18 15 16 16   Temp: (!) 97.5 F (36.4 C) 98 F (36.7 C) 97.9 F (36.6 C) 97.7 F (36.5 C)  TempSrc: Oral  Oral Oral  SpO2: 100% 100% 99% 100%  Weight:      Height:       General: alert Lochia: appropriate Uterine Fundus: firm Incision: Healing well with no significant drainage DVT Evaluation: No evidence of DVT seen on physical exam. Labs: Lab Results   Component Value Date   WBC 18.5 (H) 12/09/2019   HGB 10.0 (L) 12/09/2019   HCT 30.1 (L) 12/09/2019   MCV 88.0 12/09/2019   PLT 211 12/09/2019   CMP Latest Ref Rng & Units 11/14/2019  Glucose 70 - 99 mg/dL 425(Z)  BUN 6 - 20 mg/dL 5(L)  Creatinine 5.63 - 1.00 mg/dL 8.75  Sodium 643 - 329 mmol/L 135  Potassium 3.5 - 5.1 mmol/L 3.8  Chloride 98 - 111 mmol/L 106  CO2 22 - 32 mmol/L 20(L)  Calcium 8.9 - 10.3 mg/dL 8.9  Total Protein 6.5 - 8.1 g/dL -  Total Bilirubin 0.3 - 1.2 mg/dL -  Alkaline Phos 38 - 518 U/L -  AST 15 - 41 U/L -  ALT 0 - 44 U/L -   Edinburgh Score: Edinburgh Postnatal Depression Scale Screening Tool 12/09/2019  I have been able to laugh and see the funny side of things. 0  I have looked forward with enjoyment to things. 0  I have blamed myself unnecessarily when things went wrong. 0  I have been anxious or worried for no good reason. 0  I have felt scared or panicky for no good reason. 0  Things have been getting on top of me. 0  I have been so unhappy that I have had difficulty sleeping. 0  I have felt sad or miserable. 0  I have  been so unhappy that I have been crying. 0  The thought of harming myself has occurred to me. 0  Edinburgh Postnatal Depression Scale Total 0      After visit meds:  Allergies as of 12/10/2019      Reactions   Latex Itching      Medication List    TAKE these medications   acetaminophen 500 MG tablet Commonly known as: TYLENOL Take 1,000 mg by mouth every 6 (six) hours as needed for moderate pain or headache.   cetirizine 10 MG tablet Commonly known as: ZYRTEC Take 10 mg by mouth daily.   CitraNatal Harmony 27-1-260 MG Caps Take 1 tablet by mouth every evening.   cyclobenzaprine 10 MG tablet Commonly known as: FLEXERIL Take 1 tablet (10 mg total) by mouth 3 (three) times daily as needed for muscle spasms.   ibuprofen 600 MG tablet Commonly known as: ADVIL Take 1 tablet (600 mg total) by mouth every 6 (six)  hours.   metoCLOPramide 10 MG tablet Commonly known as: Reglan Take 1 tablet (10 mg total) by mouth every 6 (six) hours as needed for nausea or vomiting.   ondansetron 4 MG tablet Commonly known as: ZOFRAN Take 1 tablet (4 mg total) by mouth every 4 (four) hours as needed for nausea.   Oxycodone HCl 10 MG Tabs Take 1 tablet (10 mg total) by mouth every 4 (four) hours as needed (pain scale > 7).   scopolamine 1 MG/3DAYS Commonly known as: TRANSDERM-SCOP Place 1 patch (1.5 mg total) onto the skin every 3 (three) days.   senna-docusate 8.6-50 MG tablet Commonly known as: Senokot-S Take 2 tablets by mouth daily. Start taking on: December 11, 2019        Discharge home in stable condition Infant Feeding: Breast Infant Disposition:home with mother Discharge instruction: per After Visit Summary and Postpartum booklet. Activity: Advance as tolerated. Pelvic rest for 6 weeks.  Diet: routine diet Anticipated Birth Control: Unsure Postpartum Appointment:6 weeks Additional Postpartum F/U: none Future Appointments:No future appointments. Follow up Visit:      12/10/2019 Jeani Hawking, MD

## 2019-12-10 NOTE — Lactation Note (Signed)
This note was copied from a baby's chart. Lactation Consultation Note  Patient Name: Ashley Jensen [id number] Date: 12/10/2019 Reason for consult: Follow-up assessment;Nipple pain/trauma   Mom has bruising and bilateral compression stripes to both nipples.   She states she feels latching is improving.    Upon oral assessment infant has tight upper lip that has to manually be flanged when sucking finger.    Mom placed infant STS.  Dad assisted with latching in laid back position.  Tissue compressible.  Infant latched without shield, with moms pain a 5.  Several non nutritive sucks observed.  With massage and compression some swallows heard but mom felt increased pain at nipple.  Infant was taken off, LC taught parents how to break latch.  NS applied.  NS 20 was a good fit.  Mom was taught how to apply.  When infant latched with NS, pain felt zero pain.  Cheeks flush to breast and active sucking observed. Again, compression was needed to hear swallows.   LC worked with mom on hand expression.  Highly encouraged her to hand exp. Prior to and after feedings.  Also to pump after BF with a goal of at least 4-6 times in 24 hours due to difficulty latching and NS use.  Comfort gels provided.  Info. Provided on follow up with OP LC .  This was highly recommended to family.    Parents deny further questions and understand to feed on demand and listen for swallows with feedings.  Engorgement prevention discussed.  Pt. Is aware of BFSG.  Maternal Data    Feeding Feeding Type: Breast Fed  LATCH Score Latch: Grasps breast easily, tongue down, lips flanged, rhythmical sucking.  Audible Swallowing: A few with stimulation (swallows only with compression and massage)  Type of Nipple: Flat (shorter shaft, compressible tissue,)  Comfort (Breast/Nipple): Filling, red/small blisters or bruises, mild/mod discomfort (compression stripes/bruising)  Hold (Positioning): Assistance needed to correctly  position infant at breast and maintain latch.  LATCH Score: 6  Interventions Interventions: Breast feeding basics reviewed;Assisted with latch;Skin to skin;Breast massage;Support pillows;Adjust position;Position options  Lactation Tools Discussed/Used Nipple shield size: 20   Consult Status Consult Status: Complete    Maryruth Hancock [facility] 12/10/2019, 9:43 AM

## 2019-12-11 LAB — TYPE AND SCREEN
ABO/RH(D): O POS
Antibody Screen: NEGATIVE
Unit division: 0
Unit division: 0

## 2019-12-11 LAB — BPAM RBC
Blood Product Expiration Date: 202108202359
Blood Product Expiration Date: 202108202359
Unit Type and Rh: 5100
Unit Type and Rh: 5100

## 2019-12-19 DIAGNOSIS — M9903 Segmental and somatic dysfunction of lumbar region: Secondary | ICD-10-CM | POA: Diagnosis not present

## 2019-12-19 DIAGNOSIS — M9901 Segmental and somatic dysfunction of cervical region: Secondary | ICD-10-CM | POA: Diagnosis not present

## 2019-12-19 DIAGNOSIS — M9902 Segmental and somatic dysfunction of thoracic region: Secondary | ICD-10-CM | POA: Diagnosis not present

## 2019-12-19 DIAGNOSIS — M530 Cervicocranial syndrome: Secondary | ICD-10-CM | POA: Diagnosis not present

## 2020-01-29 DIAGNOSIS — Z1389 Encounter for screening for other disorder: Secondary | ICD-10-CM | POA: Diagnosis not present

## 2020-02-04 DIAGNOSIS — G4724 Circadian rhythm sleep disorder, free running type: Secondary | ICD-10-CM | POA: Diagnosis not present

## 2020-02-04 DIAGNOSIS — F419 Anxiety disorder, unspecified: Secondary | ICD-10-CM | POA: Diagnosis not present

## 2020-02-11 DIAGNOSIS — M9901 Segmental and somatic dysfunction of cervical region: Secondary | ICD-10-CM | POA: Diagnosis not present

## 2020-02-11 DIAGNOSIS — M530 Cervicocranial syndrome: Secondary | ICD-10-CM | POA: Diagnosis not present

## 2020-02-11 DIAGNOSIS — M9902 Segmental and somatic dysfunction of thoracic region: Secondary | ICD-10-CM | POA: Diagnosis not present

## 2020-02-11 DIAGNOSIS — M9903 Segmental and somatic dysfunction of lumbar region: Secondary | ICD-10-CM | POA: Diagnosis not present

## 2020-02-19 DIAGNOSIS — M9903 Segmental and somatic dysfunction of lumbar region: Secondary | ICD-10-CM | POA: Diagnosis not present

## 2020-02-19 DIAGNOSIS — M530 Cervicocranial syndrome: Secondary | ICD-10-CM | POA: Diagnosis not present

## 2020-02-19 DIAGNOSIS — I8312 Varicose veins of left lower extremity with inflammation: Secondary | ICD-10-CM | POA: Diagnosis not present

## 2020-02-19 DIAGNOSIS — M9901 Segmental and somatic dysfunction of cervical region: Secondary | ICD-10-CM | POA: Diagnosis not present

## 2020-02-19 DIAGNOSIS — I83813 Varicose veins of bilateral lower extremities with pain: Secondary | ICD-10-CM | POA: Diagnosis not present

## 2020-02-19 DIAGNOSIS — I87393 Chronic venous hypertension (idiopathic) with other complications of bilateral lower extremity: Secondary | ICD-10-CM | POA: Diagnosis not present

## 2020-02-19 DIAGNOSIS — M9902 Segmental and somatic dysfunction of thoracic region: Secondary | ICD-10-CM | POA: Diagnosis not present

## 2020-02-19 DIAGNOSIS — I8311 Varicose veins of right lower extremity with inflammation: Secondary | ICD-10-CM | POA: Diagnosis not present

## 2020-03-10 DIAGNOSIS — N760 Acute vaginitis: Secondary | ICD-10-CM | POA: Diagnosis not present

## 2020-03-10 DIAGNOSIS — O924 Hypogalactia: Secondary | ICD-10-CM | POA: Diagnosis not present

## 2020-03-18 DIAGNOSIS — G4724 Circadian rhythm sleep disorder, free running type: Secondary | ICD-10-CM | POA: Diagnosis not present

## 2020-03-18 DIAGNOSIS — F419 Anxiety disorder, unspecified: Secondary | ICD-10-CM | POA: Diagnosis not present

## 2020-03-18 DIAGNOSIS — I87393 Chronic venous hypertension (idiopathic) with other complications of bilateral lower extremity: Secondary | ICD-10-CM | POA: Diagnosis not present

## 2020-03-24 DIAGNOSIS — I83813 Varicose veins of bilateral lower extremities with pain: Secondary | ICD-10-CM | POA: Diagnosis not present

## 2020-03-24 DIAGNOSIS — I87393 Chronic venous hypertension (idiopathic) with other complications of bilateral lower extremity: Secondary | ICD-10-CM | POA: Diagnosis not present

## 2020-03-24 DIAGNOSIS — I8312 Varicose veins of left lower extremity with inflammation: Secondary | ICD-10-CM | POA: Diagnosis not present

## 2020-03-24 DIAGNOSIS — I8311 Varicose veins of right lower extremity with inflammation: Secondary | ICD-10-CM | POA: Diagnosis not present

## 2020-03-31 DIAGNOSIS — N760 Acute vaginitis: Secondary | ICD-10-CM | POA: Diagnosis not present

## 2020-04-13 DIAGNOSIS — G4724 Circadian rhythm sleep disorder, free running type: Secondary | ICD-10-CM | POA: Diagnosis not present

## 2020-04-13 DIAGNOSIS — F419 Anxiety disorder, unspecified: Secondary | ICD-10-CM | POA: Diagnosis not present

## 2020-04-14 DIAGNOSIS — M530 Cervicocranial syndrome: Secondary | ICD-10-CM | POA: Diagnosis not present

## 2020-04-14 DIAGNOSIS — M9903 Segmental and somatic dysfunction of lumbar region: Secondary | ICD-10-CM | POA: Diagnosis not present

## 2020-04-14 DIAGNOSIS — M9902 Segmental and somatic dysfunction of thoracic region: Secondary | ICD-10-CM | POA: Diagnosis not present

## 2020-04-14 DIAGNOSIS — M9901 Segmental and somatic dysfunction of cervical region: Secondary | ICD-10-CM | POA: Diagnosis not present

## 2020-04-19 DIAGNOSIS — M9902 Segmental and somatic dysfunction of thoracic region: Secondary | ICD-10-CM | POA: Diagnosis not present

## 2020-04-19 DIAGNOSIS — M9901 Segmental and somatic dysfunction of cervical region: Secondary | ICD-10-CM | POA: Diagnosis not present

## 2020-04-19 DIAGNOSIS — M530 Cervicocranial syndrome: Secondary | ICD-10-CM | POA: Diagnosis not present

## 2020-04-19 DIAGNOSIS — M9903 Segmental and somatic dysfunction of lumbar region: Secondary | ICD-10-CM | POA: Diagnosis not present

## 2020-05-03 DIAGNOSIS — I8312 Varicose veins of left lower extremity with inflammation: Secondary | ICD-10-CM | POA: Diagnosis not present

## 2020-05-03 DIAGNOSIS — I83812 Varicose veins of left lower extremities with pain: Secondary | ICD-10-CM | POA: Diagnosis not present

## 2020-05-05 DIAGNOSIS — I8312 Varicose veins of left lower extremity with inflammation: Secondary | ICD-10-CM | POA: Diagnosis not present

## 2020-05-13 DIAGNOSIS — M9903 Segmental and somatic dysfunction of lumbar region: Secondary | ICD-10-CM | POA: Diagnosis not present

## 2020-05-13 DIAGNOSIS — M9902 Segmental and somatic dysfunction of thoracic region: Secondary | ICD-10-CM | POA: Diagnosis not present

## 2020-05-13 DIAGNOSIS — M530 Cervicocranial syndrome: Secondary | ICD-10-CM | POA: Diagnosis not present

## 2020-05-13 DIAGNOSIS — M9901 Segmental and somatic dysfunction of cervical region: Secondary | ICD-10-CM | POA: Diagnosis not present

## 2020-05-13 DIAGNOSIS — I8312 Varicose veins of left lower extremity with inflammation: Secondary | ICD-10-CM | POA: Diagnosis not present

## 2020-06-08 DIAGNOSIS — F419 Anxiety disorder, unspecified: Secondary | ICD-10-CM | POA: Diagnosis not present

## 2020-06-08 DIAGNOSIS — F5101 Primary insomnia: Secondary | ICD-10-CM | POA: Diagnosis not present

## 2020-07-31 DIAGNOSIS — J0141 Acute recurrent pansinusitis: Secondary | ICD-10-CM | POA: Diagnosis not present

## 2020-07-31 DIAGNOSIS — J0140 Acute pansinusitis, unspecified: Secondary | ICD-10-CM | POA: Diagnosis not present

## 2020-08-03 DIAGNOSIS — F419 Anxiety disorder, unspecified: Secondary | ICD-10-CM | POA: Diagnosis not present

## 2020-08-03 DIAGNOSIS — F5101 Primary insomnia: Secondary | ICD-10-CM | POA: Diagnosis not present

## 2020-09-24 DIAGNOSIS — M9902 Segmental and somatic dysfunction of thoracic region: Secondary | ICD-10-CM | POA: Diagnosis not present

## 2020-09-24 DIAGNOSIS — M9903 Segmental and somatic dysfunction of lumbar region: Secondary | ICD-10-CM | POA: Diagnosis not present

## 2020-09-24 DIAGNOSIS — M9901 Segmental and somatic dysfunction of cervical region: Secondary | ICD-10-CM | POA: Diagnosis not present

## 2020-09-24 DIAGNOSIS — M530 Cervicocranial syndrome: Secondary | ICD-10-CM | POA: Diagnosis not present

## 2020-10-04 DIAGNOSIS — J0140 Acute pansinusitis, unspecified: Secondary | ICD-10-CM | POA: Diagnosis not present

## 2020-10-22 DIAGNOSIS — M9903 Segmental and somatic dysfunction of lumbar region: Secondary | ICD-10-CM | POA: Diagnosis not present

## 2020-10-22 DIAGNOSIS — M9902 Segmental and somatic dysfunction of thoracic region: Secondary | ICD-10-CM | POA: Diagnosis not present

## 2020-10-22 DIAGNOSIS — M9901 Segmental and somatic dysfunction of cervical region: Secondary | ICD-10-CM | POA: Diagnosis not present

## 2020-10-22 DIAGNOSIS — M530 Cervicocranial syndrome: Secondary | ICD-10-CM | POA: Diagnosis not present

## 2020-11-01 DIAGNOSIS — Z1321 Encounter for screening for nutritional disorder: Secondary | ICD-10-CM | POA: Diagnosis not present

## 2020-11-01 DIAGNOSIS — L559 Sunburn, unspecified: Secondary | ICD-10-CM | POA: Diagnosis not present

## 2020-11-01 DIAGNOSIS — H9202 Otalgia, left ear: Secondary | ICD-10-CM | POA: Diagnosis not present

## 2020-11-01 DIAGNOSIS — R413 Other amnesia: Secondary | ICD-10-CM | POA: Diagnosis not present

## 2020-11-01 DIAGNOSIS — R635 Abnormal weight gain: Secondary | ICD-10-CM | POA: Diagnosis not present

## 2020-11-01 DIAGNOSIS — F418 Other specified anxiety disorders: Secondary | ICD-10-CM | POA: Diagnosis not present

## 2020-11-29 DIAGNOSIS — M9901 Segmental and somatic dysfunction of cervical region: Secondary | ICD-10-CM | POA: Diagnosis not present

## 2020-11-29 DIAGNOSIS — M9902 Segmental and somatic dysfunction of thoracic region: Secondary | ICD-10-CM | POA: Diagnosis not present

## 2020-11-29 DIAGNOSIS — M9903 Segmental and somatic dysfunction of lumbar region: Secondary | ICD-10-CM | POA: Diagnosis not present

## 2020-11-29 DIAGNOSIS — M530 Cervicocranial syndrome: Secondary | ICD-10-CM | POA: Diagnosis not present

## 2020-11-30 DIAGNOSIS — J019 Acute sinusitis, unspecified: Secondary | ICD-10-CM | POA: Diagnosis not present

## 2020-12-29 DIAGNOSIS — Z6832 Body mass index (BMI) 32.0-32.9, adult: Secondary | ICD-10-CM | POA: Diagnosis not present

## 2020-12-29 DIAGNOSIS — Z01419 Encounter for gynecological examination (general) (routine) without abnormal findings: Secondary | ICD-10-CM | POA: Diagnosis not present

## 2020-12-29 DIAGNOSIS — E559 Vitamin D deficiency, unspecified: Secondary | ICD-10-CM | POA: Diagnosis not present

## 2021-01-03 DIAGNOSIS — Z03818 Encounter for observation for suspected exposure to other biological agents ruled out: Secondary | ICD-10-CM | POA: Diagnosis not present

## 2021-01-03 DIAGNOSIS — J029 Acute pharyngitis, unspecified: Secondary | ICD-10-CM | POA: Diagnosis not present

## 2021-01-03 DIAGNOSIS — Z20822 Contact with and (suspected) exposure to covid-19: Secondary | ICD-10-CM | POA: Diagnosis not present

## 2021-02-01 DIAGNOSIS — Z6833 Body mass index (BMI) 33.0-33.9, adult: Secondary | ICD-10-CM | POA: Diagnosis not present

## 2021-02-01 DIAGNOSIS — R946 Abnormal results of thyroid function studies: Secondary | ICD-10-CM | POA: Diagnosis not present

## 2021-02-02 DIAGNOSIS — M530 Cervicocranial syndrome: Secondary | ICD-10-CM | POA: Diagnosis not present

## 2021-02-02 DIAGNOSIS — M9902 Segmental and somatic dysfunction of thoracic region: Secondary | ICD-10-CM | POA: Diagnosis not present

## 2021-02-02 DIAGNOSIS — M9901 Segmental and somatic dysfunction of cervical region: Secondary | ICD-10-CM | POA: Diagnosis not present

## 2021-02-02 DIAGNOSIS — M9903 Segmental and somatic dysfunction of lumbar region: Secondary | ICD-10-CM | POA: Diagnosis not present

## 2021-02-25 IMAGING — US US OB COMP LESS 14 WK
1 series · 15 of 28 positions shown · non-contrast
Comparison: None.

CLINICAL DATA: Pelvic pain, hyperemesis with positive pregnancy
test

EXAM:
OBSTETRIC <14 WK ULTRASOUND
TECHNIQUE: Transabdominal ultrasound was performed for evaluation of the
gestation as well as the maternal uterus and adnexal regions.

[Series 1: us ob comp less 14 wk · 15 of 34 slices shown]
[im 1/34]
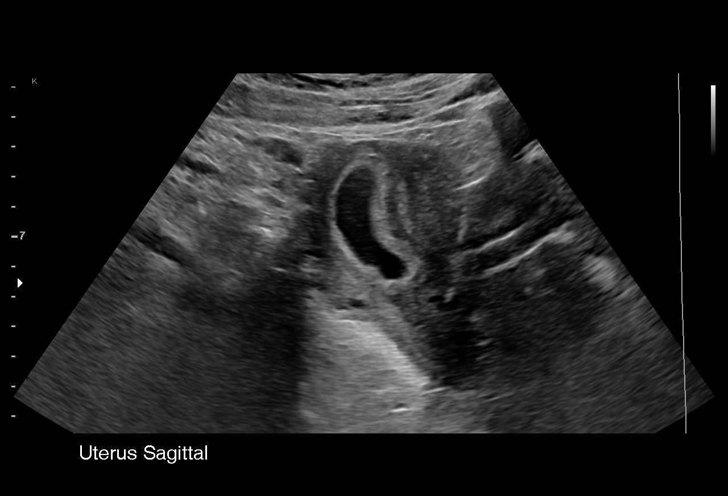
[im 3/34]
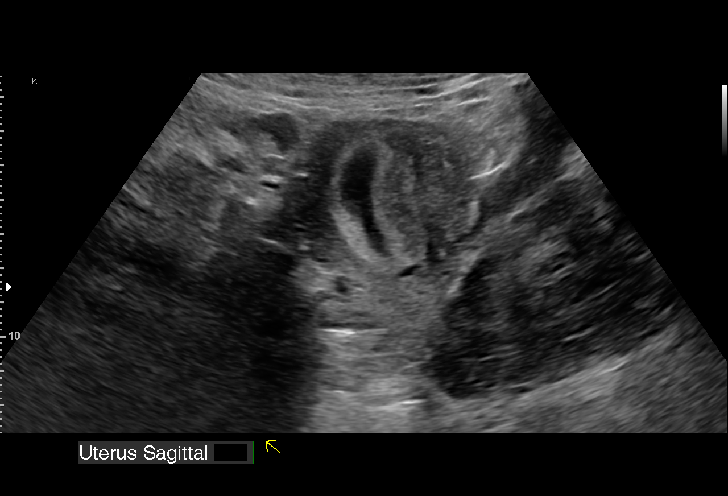
[im 5/34]
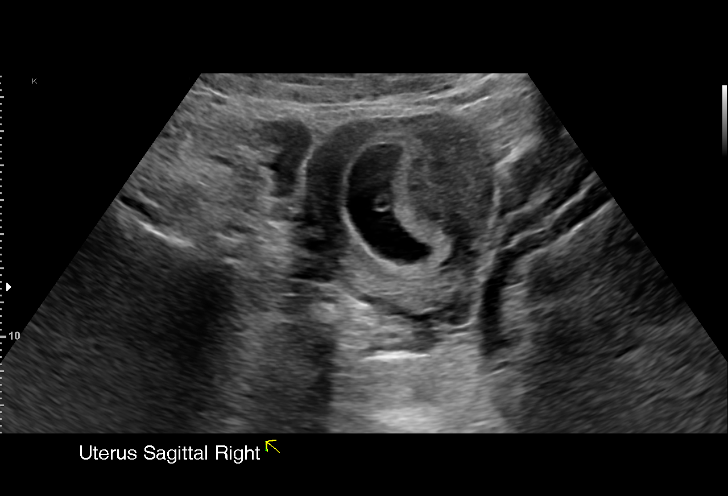
[im 8/34]
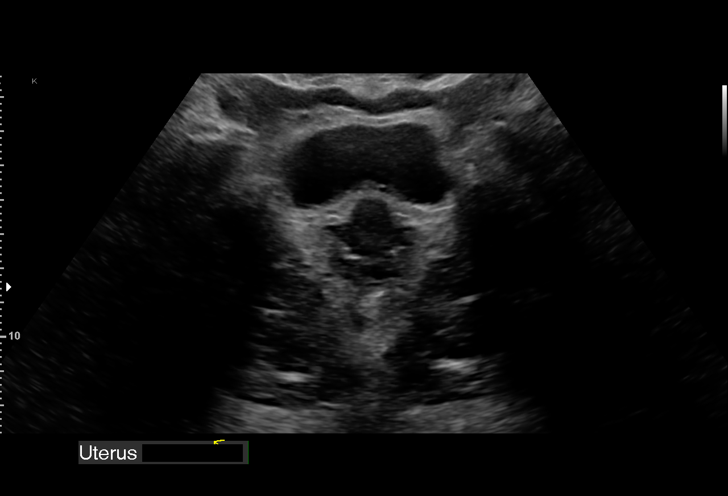
[im 10/34]
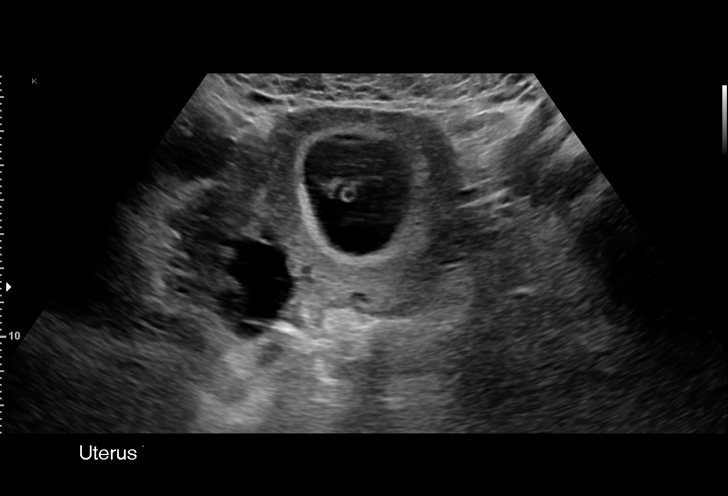
[im 13/34]
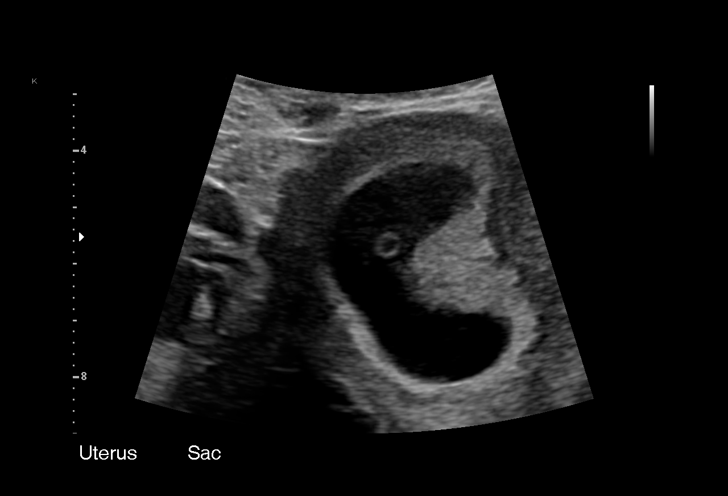
[im 15/34]
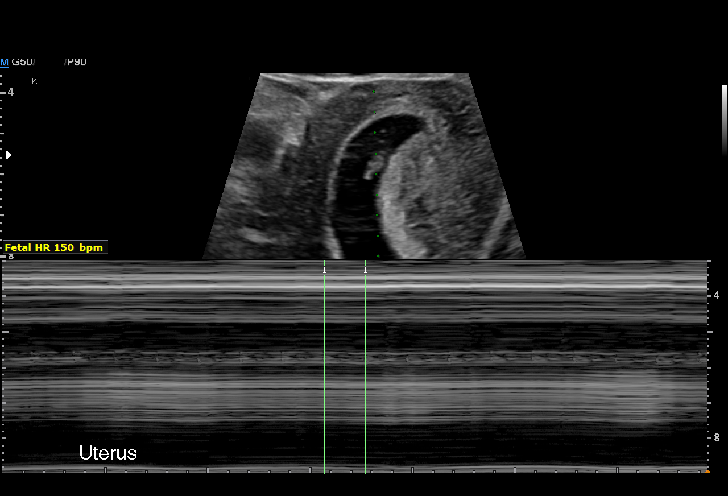
[im 18/34]
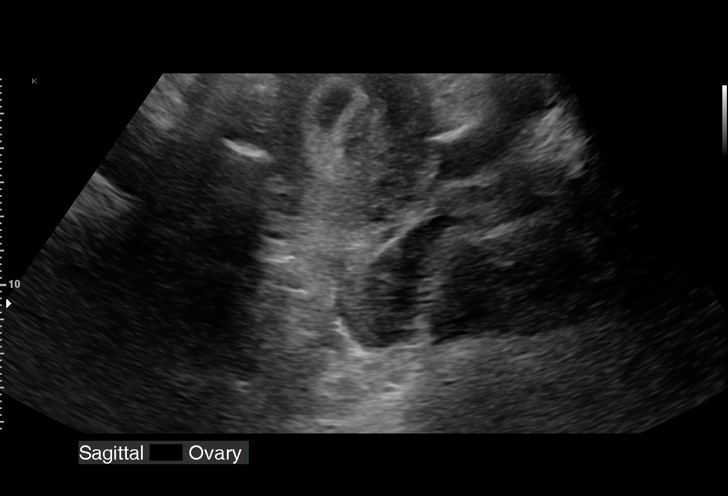
[im 19/34]
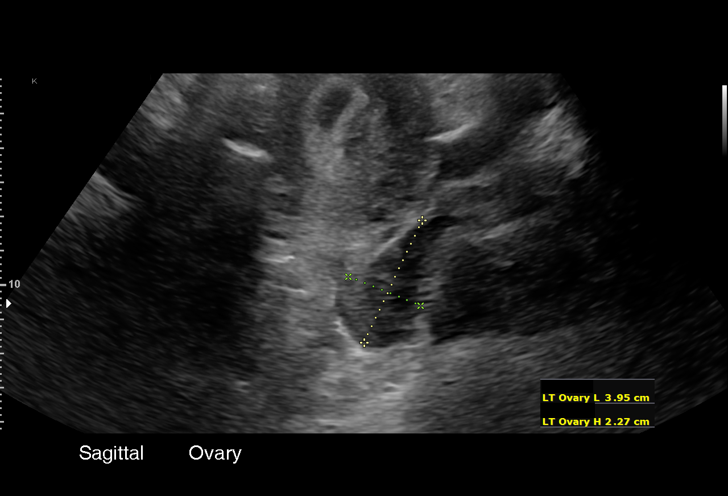
[im 21/34]
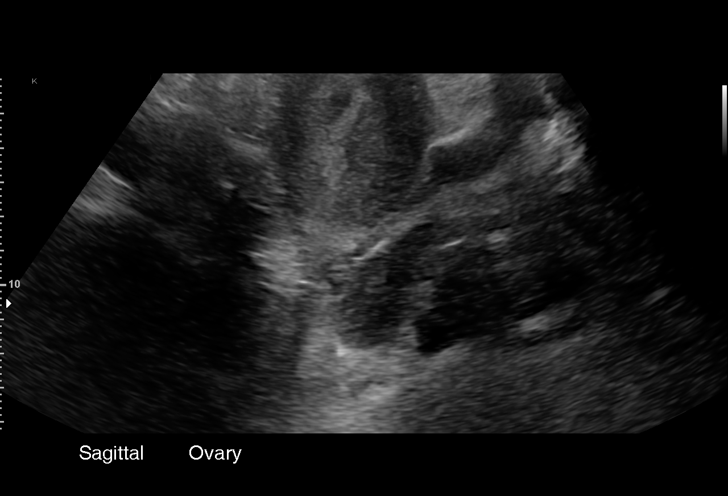
[im 24/34]
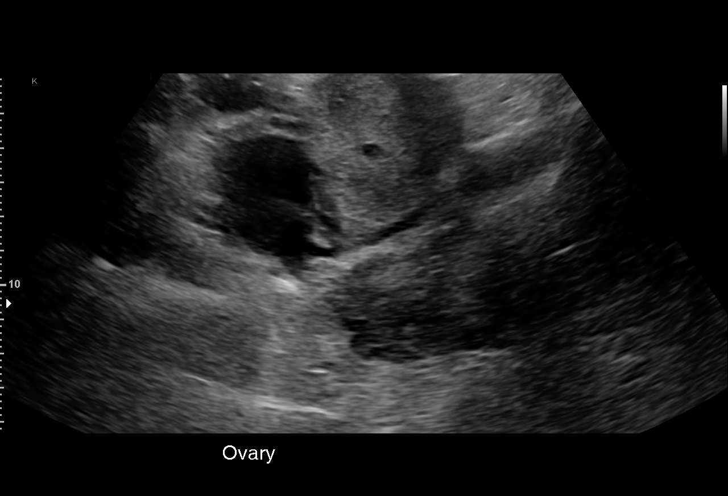
[im 26/34]
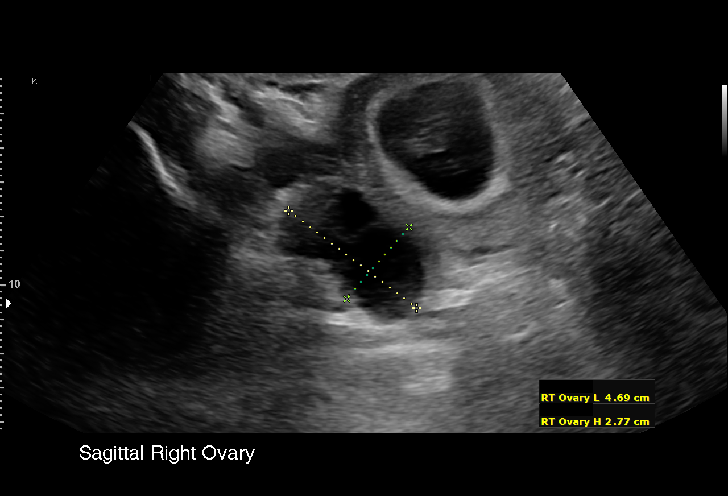
[im 29/34]
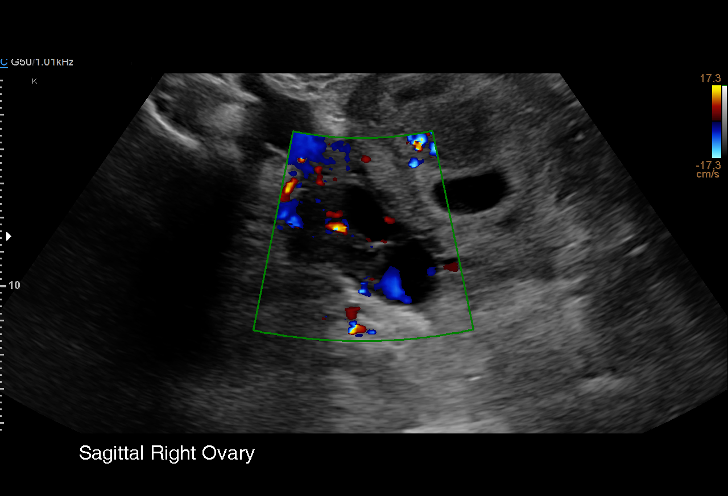
[im 31/34]
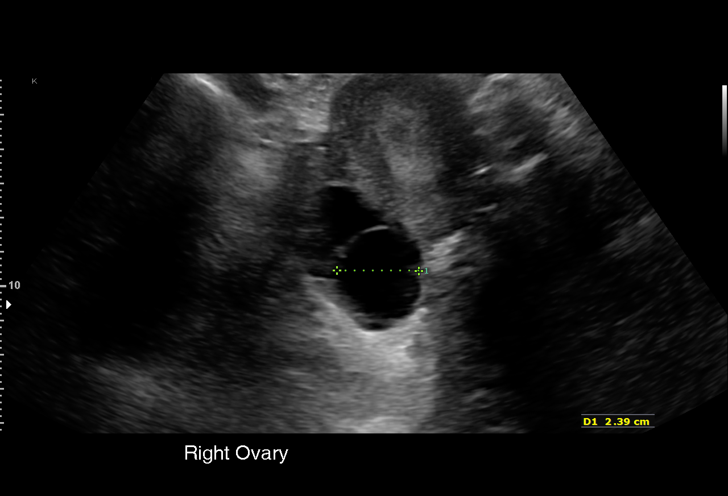
[im 34/34]
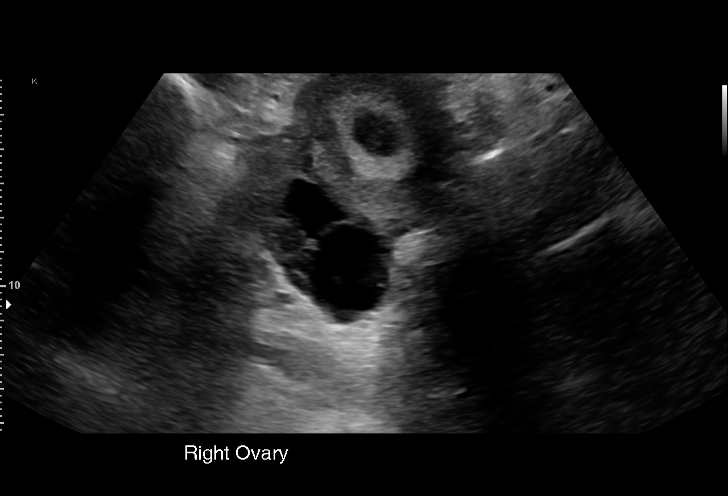

[15 of 28 positions shown; findings below may reference images not displayed]

FINDINGS: Intrauterine gestational sac: Present

Yolk sac:  Present

Embryo:  Present

Cardiac Activity: Present

Heart Rate: 150 bpm

CRL:   11.3 mm   7 w 1 d                  US EDC: 12/12/2019

Subchorionic hemorrhage:  None visualized.

Maternal uterus/adnexae: Cystic changes are noted in the right
ovary. The largest of these measures 2.6 cm. Left ovary appears
within normal limits. No uterine abnormality is noted. Trace free
fluid is noted within the pelvis.
IMPRESSION: Single live intrauterine gestation at 7 weeks 1 day.

No acute abnormality noted.

## 2021-03-04 DIAGNOSIS — F5101 Primary insomnia: Secondary | ICD-10-CM | POA: Diagnosis not present

## 2021-03-04 DIAGNOSIS — F419 Anxiety disorder, unspecified: Secondary | ICD-10-CM | POA: Diagnosis not present

## 2021-03-09 DIAGNOSIS — M530 Cervicocranial syndrome: Secondary | ICD-10-CM | POA: Diagnosis not present

## 2021-03-09 DIAGNOSIS — M9902 Segmental and somatic dysfunction of thoracic region: Secondary | ICD-10-CM | POA: Diagnosis not present

## 2021-03-09 DIAGNOSIS — M9901 Segmental and somatic dysfunction of cervical region: Secondary | ICD-10-CM | POA: Diagnosis not present

## 2021-03-09 DIAGNOSIS — M9903 Segmental and somatic dysfunction of lumbar region: Secondary | ICD-10-CM | POA: Diagnosis not present

## 2021-04-20 DIAGNOSIS — R1030 Lower abdominal pain, unspecified: Secondary | ICD-10-CM | POA: Diagnosis not present

## 2021-06-08 DIAGNOSIS — M9903 Segmental and somatic dysfunction of lumbar region: Secondary | ICD-10-CM | POA: Diagnosis not present

## 2021-06-08 DIAGNOSIS — M9902 Segmental and somatic dysfunction of thoracic region: Secondary | ICD-10-CM | POA: Diagnosis not present

## 2021-06-08 DIAGNOSIS — M530 Cervicocranial syndrome: Secondary | ICD-10-CM | POA: Diagnosis not present

## 2021-06-08 DIAGNOSIS — M9901 Segmental and somatic dysfunction of cervical region: Secondary | ICD-10-CM | POA: Diagnosis not present

## 2021-06-23 DIAGNOSIS — M530 Cervicocranial syndrome: Secondary | ICD-10-CM | POA: Diagnosis not present

## 2021-06-23 DIAGNOSIS — M9903 Segmental and somatic dysfunction of lumbar region: Secondary | ICD-10-CM | POA: Diagnosis not present

## 2021-06-23 DIAGNOSIS — M9901 Segmental and somatic dysfunction of cervical region: Secondary | ICD-10-CM | POA: Diagnosis not present

## 2021-06-23 DIAGNOSIS — M9902 Segmental and somatic dysfunction of thoracic region: Secondary | ICD-10-CM | POA: Diagnosis not present

## 2021-07-15 DIAGNOSIS — L03019 Cellulitis of unspecified finger: Secondary | ICD-10-CM | POA: Diagnosis not present

## 2021-07-15 DIAGNOSIS — M7989 Other specified soft tissue disorders: Secondary | ICD-10-CM | POA: Diagnosis not present

## 2021-08-01 DIAGNOSIS — N760 Acute vaginitis: Secondary | ICD-10-CM | POA: Diagnosis not present

## 2021-08-01 DIAGNOSIS — N898 Other specified noninflammatory disorders of vagina: Secondary | ICD-10-CM | POA: Diagnosis not present

## 2021-08-01 DIAGNOSIS — Z3202 Encounter for pregnancy test, result negative: Secondary | ICD-10-CM | POA: Diagnosis not present

## 2021-08-01 DIAGNOSIS — N912 Amenorrhea, unspecified: Secondary | ICD-10-CM | POA: Diagnosis not present

## 2021-08-22 DIAGNOSIS — F419 Anxiety disorder, unspecified: Secondary | ICD-10-CM | POA: Diagnosis not present

## 2021-08-22 DIAGNOSIS — F5101 Primary insomnia: Secondary | ICD-10-CM | POA: Diagnosis not present

## 2021-09-19 DIAGNOSIS — L853 Xerosis cutis: Secondary | ICD-10-CM | POA: Diagnosis not present

## 2021-09-29 DIAGNOSIS — M9903 Segmental and somatic dysfunction of lumbar region: Secondary | ICD-10-CM | POA: Diagnosis not present

## 2021-09-29 DIAGNOSIS — M9905 Segmental and somatic dysfunction of pelvic region: Secondary | ICD-10-CM | POA: Diagnosis not present

## 2021-09-29 DIAGNOSIS — M530 Cervicocranial syndrome: Secondary | ICD-10-CM | POA: Diagnosis not present

## 2021-09-29 DIAGNOSIS — M9901 Segmental and somatic dysfunction of cervical region: Secondary | ICD-10-CM | POA: Diagnosis not present

## 2021-09-29 DIAGNOSIS — M9902 Segmental and somatic dysfunction of thoracic region: Secondary | ICD-10-CM | POA: Diagnosis not present

## 2021-10-14 DIAGNOSIS — F5101 Primary insomnia: Secondary | ICD-10-CM | POA: Diagnosis not present

## 2021-10-14 DIAGNOSIS — F419 Anxiety disorder, unspecified: Secondary | ICD-10-CM | POA: Diagnosis not present

## 2021-11-08 DIAGNOSIS — M9903 Segmental and somatic dysfunction of lumbar region: Secondary | ICD-10-CM | POA: Diagnosis not present

## 2021-11-08 DIAGNOSIS — M9902 Segmental and somatic dysfunction of thoracic region: Secondary | ICD-10-CM | POA: Diagnosis not present

## 2021-11-08 DIAGNOSIS — M9901 Segmental and somatic dysfunction of cervical region: Secondary | ICD-10-CM | POA: Diagnosis not present

## 2021-11-08 DIAGNOSIS — M530 Cervicocranial syndrome: Secondary | ICD-10-CM | POA: Diagnosis not present

## 2021-11-08 DIAGNOSIS — M9905 Segmental and somatic dysfunction of pelvic region: Secondary | ICD-10-CM | POA: Diagnosis not present

## 2021-12-21 DIAGNOSIS — F418 Other specified anxiety disorders: Secondary | ICD-10-CM | POA: Diagnosis not present

## 2021-12-21 DIAGNOSIS — E538 Deficiency of other specified B group vitamins: Secondary | ICD-10-CM | POA: Diagnosis not present

## 2021-12-21 DIAGNOSIS — E663 Overweight: Secondary | ICD-10-CM | POA: Diagnosis not present

## 2021-12-21 DIAGNOSIS — R011 Cardiac murmur, unspecified: Secondary | ICD-10-CM | POA: Diagnosis not present

## 2021-12-21 DIAGNOSIS — E8889 Other specified metabolic disorders: Secondary | ICD-10-CM | POA: Diagnosis not present

## 2021-12-21 DIAGNOSIS — G43909 Migraine, unspecified, not intractable, without status migrainosus: Secondary | ICD-10-CM | POA: Diagnosis not present

## 2021-12-21 DIAGNOSIS — R5383 Other fatigue: Secondary | ICD-10-CM | POA: Diagnosis not present

## 2021-12-21 DIAGNOSIS — Z72820 Sleep deprivation: Secondary | ICD-10-CM | POA: Diagnosis not present

## 2021-12-21 DIAGNOSIS — Z79899 Other long term (current) drug therapy: Secondary | ICD-10-CM | POA: Diagnosis not present

## 2021-12-21 DIAGNOSIS — Z13220 Encounter for screening for lipoid disorders: Secondary | ICD-10-CM | POA: Diagnosis not present

## 2021-12-21 DIAGNOSIS — Z86718 Personal history of other venous thrombosis and embolism: Secondary | ICD-10-CM | POA: Diagnosis not present

## 2022-01-09 DIAGNOSIS — N760 Acute vaginitis: Secondary | ICD-10-CM | POA: Diagnosis not present

## 2022-01-09 DIAGNOSIS — R300 Dysuria: Secondary | ICD-10-CM | POA: Diagnosis not present

## 2022-01-26 DIAGNOSIS — Z6827 Body mass index (BMI) 27.0-27.9, adult: Secondary | ICD-10-CM | POA: Diagnosis not present

## 2022-01-26 DIAGNOSIS — E538 Deficiency of other specified B group vitamins: Secondary | ICD-10-CM | POA: Diagnosis not present

## 2022-01-26 DIAGNOSIS — Z9189 Other specified personal risk factors, not elsewhere classified: Secondary | ICD-10-CM | POA: Diagnosis not present

## 2022-01-26 DIAGNOSIS — G43909 Migraine, unspecified, not intractable, without status migrainosus: Secondary | ICD-10-CM | POA: Diagnosis not present

## 2022-01-26 DIAGNOSIS — Z72820 Sleep deprivation: Secondary | ICD-10-CM | POA: Diagnosis not present

## 2022-02-02 DIAGNOSIS — Z6827 Body mass index (BMI) 27.0-27.9, adult: Secondary | ICD-10-CM | POA: Diagnosis not present

## 2022-02-02 DIAGNOSIS — Z01419 Encounter for gynecological examination (general) (routine) without abnormal findings: Secondary | ICD-10-CM | POA: Diagnosis not present

## 2022-02-02 DIAGNOSIS — Z3202 Encounter for pregnancy test, result negative: Secondary | ICD-10-CM | POA: Diagnosis not present

## 2022-02-07 DIAGNOSIS — J069 Acute upper respiratory infection, unspecified: Secondary | ICD-10-CM | POA: Diagnosis not present

## 2022-02-07 DIAGNOSIS — R059 Cough, unspecified: Secondary | ICD-10-CM | POA: Diagnosis not present

## 2022-02-07 DIAGNOSIS — J060 Acute laryngopharyngitis: Secondary | ICD-10-CM | POA: Diagnosis not present

## 2022-02-20 DIAGNOSIS — E559 Vitamin D deficiency, unspecified: Secondary | ICD-10-CM | POA: Diagnosis not present

## 2022-02-20 DIAGNOSIS — E538 Deficiency of other specified B group vitamins: Secondary | ICD-10-CM | POA: Diagnosis not present

## 2022-02-20 DIAGNOSIS — G43909 Migraine, unspecified, not intractable, without status migrainosus: Secondary | ICD-10-CM | POA: Diagnosis not present

## 2022-02-20 DIAGNOSIS — Z72820 Sleep deprivation: Secondary | ICD-10-CM | POA: Diagnosis not present

## 2022-02-20 DIAGNOSIS — Z9189 Other specified personal risk factors, not elsewhere classified: Secondary | ICD-10-CM | POA: Diagnosis not present

## 2022-02-23 DIAGNOSIS — M9903 Segmental and somatic dysfunction of lumbar region: Secondary | ICD-10-CM | POA: Diagnosis not present

## 2022-02-23 DIAGNOSIS — M9901 Segmental and somatic dysfunction of cervical region: Secondary | ICD-10-CM | POA: Diagnosis not present

## 2022-02-23 DIAGNOSIS — M9902 Segmental and somatic dysfunction of thoracic region: Secondary | ICD-10-CM | POA: Diagnosis not present

## 2022-02-23 DIAGNOSIS — M9905 Segmental and somatic dysfunction of pelvic region: Secondary | ICD-10-CM | POA: Diagnosis not present

## 2022-02-23 DIAGNOSIS — M530 Cervicocranial syndrome: Secondary | ICD-10-CM | POA: Diagnosis not present

## 2022-03-08 DIAGNOSIS — F418 Other specified anxiety disorders: Secondary | ICD-10-CM | POA: Diagnosis not present

## 2022-03-08 DIAGNOSIS — G43909 Migraine, unspecified, not intractable, without status migrainosus: Secondary | ICD-10-CM | POA: Diagnosis not present

## 2022-03-16 DIAGNOSIS — Z72820 Sleep deprivation: Secondary | ICD-10-CM | POA: Diagnosis not present

## 2022-03-16 DIAGNOSIS — E538 Deficiency of other specified B group vitamins: Secondary | ICD-10-CM | POA: Diagnosis not present

## 2022-03-16 DIAGNOSIS — E559 Vitamin D deficiency, unspecified: Secondary | ICD-10-CM | POA: Diagnosis not present

## 2022-03-16 DIAGNOSIS — G43909 Migraine, unspecified, not intractable, without status migrainosus: Secondary | ICD-10-CM | POA: Diagnosis not present

## 2022-03-16 DIAGNOSIS — Z9189 Other specified personal risk factors, not elsewhere classified: Secondary | ICD-10-CM | POA: Diagnosis not present

## 2022-03-20 DIAGNOSIS — M9902 Segmental and somatic dysfunction of thoracic region: Secondary | ICD-10-CM | POA: Diagnosis not present

## 2022-03-20 DIAGNOSIS — M9903 Segmental and somatic dysfunction of lumbar region: Secondary | ICD-10-CM | POA: Diagnosis not present

## 2022-03-20 DIAGNOSIS — M9901 Segmental and somatic dysfunction of cervical region: Secondary | ICD-10-CM | POA: Diagnosis not present

## 2022-03-20 DIAGNOSIS — M530 Cervicocranial syndrome: Secondary | ICD-10-CM | POA: Diagnosis not present

## 2022-03-20 DIAGNOSIS — M9905 Segmental and somatic dysfunction of pelvic region: Secondary | ICD-10-CM | POA: Diagnosis not present

## 2022-03-22 DIAGNOSIS — M9903 Segmental and somatic dysfunction of lumbar region: Secondary | ICD-10-CM | POA: Diagnosis not present

## 2022-03-22 DIAGNOSIS — M9902 Segmental and somatic dysfunction of thoracic region: Secondary | ICD-10-CM | POA: Diagnosis not present

## 2022-03-22 DIAGNOSIS — M9905 Segmental and somatic dysfunction of pelvic region: Secondary | ICD-10-CM | POA: Diagnosis not present

## 2022-03-22 DIAGNOSIS — M9901 Segmental and somatic dysfunction of cervical region: Secondary | ICD-10-CM | POA: Diagnosis not present

## 2022-03-22 DIAGNOSIS — M530 Cervicocranial syndrome: Secondary | ICD-10-CM | POA: Diagnosis not present

## 2022-03-29 DIAGNOSIS — M9903 Segmental and somatic dysfunction of lumbar region: Secondary | ICD-10-CM | POA: Diagnosis not present

## 2022-03-29 DIAGNOSIS — M530 Cervicocranial syndrome: Secondary | ICD-10-CM | POA: Diagnosis not present

## 2022-03-29 DIAGNOSIS — M9902 Segmental and somatic dysfunction of thoracic region: Secondary | ICD-10-CM | POA: Diagnosis not present

## 2022-03-29 DIAGNOSIS — M9905 Segmental and somatic dysfunction of pelvic region: Secondary | ICD-10-CM | POA: Diagnosis not present

## 2022-03-29 DIAGNOSIS — M9901 Segmental and somatic dysfunction of cervical region: Secondary | ICD-10-CM | POA: Diagnosis not present

## 2022-04-04 DIAGNOSIS — E559 Vitamin D deficiency, unspecified: Secondary | ICD-10-CM | POA: Diagnosis not present

## 2022-04-04 DIAGNOSIS — F418 Other specified anxiety disorders: Secondary | ICD-10-CM | POA: Diagnosis not present

## 2022-04-04 DIAGNOSIS — E538 Deficiency of other specified B group vitamins: Secondary | ICD-10-CM | POA: Diagnosis not present

## 2022-04-04 DIAGNOSIS — G43909 Migraine, unspecified, not intractable, without status migrainosus: Secondary | ICD-10-CM | POA: Diagnosis not present

## 2022-04-08 DIAGNOSIS — L0100 Impetigo, unspecified: Secondary | ICD-10-CM | POA: Diagnosis not present

## 2022-04-08 DIAGNOSIS — J069 Acute upper respiratory infection, unspecified: Secondary | ICD-10-CM | POA: Diagnosis not present

## 2022-04-08 DIAGNOSIS — J029 Acute pharyngitis, unspecified: Secondary | ICD-10-CM | POA: Diagnosis not present

## 2022-04-08 DIAGNOSIS — Z6825 Body mass index (BMI) 25.0-25.9, adult: Secondary | ICD-10-CM | POA: Diagnosis not present

## 2022-04-11 DIAGNOSIS — J329 Chronic sinusitis, unspecified: Secondary | ICD-10-CM | POA: Diagnosis not present

## 2022-04-11 DIAGNOSIS — G43909 Migraine, unspecified, not intractable, without status migrainosus: Secondary | ICD-10-CM | POA: Diagnosis not present

## 2022-04-11 DIAGNOSIS — F418 Other specified anxiety disorders: Secondary | ICD-10-CM | POA: Diagnosis not present

## 2022-04-20 ENCOUNTER — Other Ambulatory Visit: Payer: Self-pay

## 2022-04-20 ENCOUNTER — Encounter (HOSPITAL_BASED_OUTPATIENT_CLINIC_OR_DEPARTMENT_OTHER): Payer: Self-pay | Admitting: *Deleted

## 2022-04-20 ENCOUNTER — Emergency Department (HOSPITAL_BASED_OUTPATIENT_CLINIC_OR_DEPARTMENT_OTHER): Payer: BC Managed Care – PPO | Admitting: Radiology

## 2022-04-20 ENCOUNTER — Emergency Department (HOSPITAL_BASED_OUTPATIENT_CLINIC_OR_DEPARTMENT_OTHER)
Admission: EM | Admit: 2022-04-20 | Discharge: 2022-04-20 | Discharge status: Against Medical Advice | Payer: BC Managed Care – PPO | Attending: Emergency Medicine | Admitting: Emergency Medicine

## 2022-04-20 DIAGNOSIS — S61211A Laceration without foreign body of left index finger without damage to nail, initial encounter: Secondary | ICD-10-CM | POA: Diagnosis not present

## 2022-04-20 DIAGNOSIS — S65501A Unspecified injury of blood vessel of left index finger, initial encounter: Secondary | ICD-10-CM | POA: Diagnosis not present

## 2022-04-20 DIAGNOSIS — W260XXA Contact with knife, initial encounter: Secondary | ICD-10-CM | POA: Insufficient documentation

## 2022-04-20 MED ORDER — IBUPROFEN 800 MG PO TABS
800.0000 mg | ORAL_TABLET | Freq: Once | ORAL | Status: AC
  Administered 2022-04-20: 800 mg via ORAL
  Filled 2022-04-20: qty 1

## 2022-04-20 NOTE — ED Notes (Signed)
Pt was called in lobby x2 for xray without answer

## 2022-04-20 NOTE — ED Triage Notes (Signed)
Pt cut her left index finger with a knife while cutting cake today.  Lac washed out with NS in triage.  BLeeding controlled.  Last tetanus 2.5 years ago

## 2022-05-01 DIAGNOSIS — M530 Cervicocranial syndrome: Secondary | ICD-10-CM | POA: Diagnosis not present

## 2022-05-01 DIAGNOSIS — M9901 Segmental and somatic dysfunction of cervical region: Secondary | ICD-10-CM | POA: Diagnosis not present

## 2022-05-01 DIAGNOSIS — M9903 Segmental and somatic dysfunction of lumbar region: Secondary | ICD-10-CM | POA: Diagnosis not present

## 2022-05-01 DIAGNOSIS — E538 Deficiency of other specified B group vitamins: Secondary | ICD-10-CM | POA: Diagnosis not present

## 2022-05-01 DIAGNOSIS — M9902 Segmental and somatic dysfunction of thoracic region: Secondary | ICD-10-CM | POA: Diagnosis not present

## 2022-05-01 DIAGNOSIS — G43909 Migraine, unspecified, not intractable, without status migrainosus: Secondary | ICD-10-CM | POA: Diagnosis not present

## 2022-05-01 DIAGNOSIS — F418 Other specified anxiety disorders: Secondary | ICD-10-CM | POA: Diagnosis not present

## 2022-05-01 DIAGNOSIS — E663 Overweight: Secondary | ICD-10-CM | POA: Diagnosis not present

## 2022-05-01 DIAGNOSIS — M9905 Segmental and somatic dysfunction of pelvic region: Secondary | ICD-10-CM | POA: Diagnosis not present

## 2022-05-04 DIAGNOSIS — F330 Major depressive disorder, recurrent, mild: Secondary | ICD-10-CM | POA: Diagnosis not present

## 2022-05-24 DIAGNOSIS — F330 Major depressive disorder, recurrent, mild: Secondary | ICD-10-CM | POA: Diagnosis not present

## 2022-06-06 DIAGNOSIS — F418 Other specified anxiety disorders: Secondary | ICD-10-CM | POA: Diagnosis not present

## 2022-06-06 DIAGNOSIS — E538 Deficiency of other specified B group vitamins: Secondary | ICD-10-CM | POA: Diagnosis not present

## 2022-06-06 DIAGNOSIS — E663 Overweight: Secondary | ICD-10-CM | POA: Diagnosis not present

## 2022-06-06 DIAGNOSIS — G43909 Migraine, unspecified, not intractable, without status migrainosus: Secondary | ICD-10-CM | POA: Diagnosis not present

## 2022-06-14 DIAGNOSIS — M9903 Segmental and somatic dysfunction of lumbar region: Secondary | ICD-10-CM | POA: Diagnosis not present

## 2022-06-14 DIAGNOSIS — M9902 Segmental and somatic dysfunction of thoracic region: Secondary | ICD-10-CM | POA: Diagnosis not present

## 2022-06-14 DIAGNOSIS — M9905 Segmental and somatic dysfunction of pelvic region: Secondary | ICD-10-CM | POA: Diagnosis not present

## 2022-06-25 DIAGNOSIS — L500 Allergic urticaria: Secondary | ICD-10-CM | POA: Diagnosis not present

## 2022-07-04 DIAGNOSIS — G43909 Migraine, unspecified, not intractable, without status migrainosus: Secondary | ICD-10-CM | POA: Diagnosis not present

## 2022-07-04 DIAGNOSIS — F419 Anxiety disorder, unspecified: Secondary | ICD-10-CM | POA: Diagnosis not present

## 2022-07-04 DIAGNOSIS — F418 Other specified anxiety disorders: Secondary | ICD-10-CM | POA: Diagnosis not present

## 2022-07-04 DIAGNOSIS — G43809 Other migraine, not intractable, without status migrainosus: Secondary | ICD-10-CM | POA: Diagnosis not present

## 2022-07-06 DIAGNOSIS — M9905 Segmental and somatic dysfunction of pelvic region: Secondary | ICD-10-CM | POA: Diagnosis not present

## 2022-07-06 DIAGNOSIS — M530 Cervicocranial syndrome: Secondary | ICD-10-CM | POA: Diagnosis not present

## 2022-07-06 DIAGNOSIS — M9902 Segmental and somatic dysfunction of thoracic region: Secondary | ICD-10-CM | POA: Diagnosis not present

## 2022-07-06 DIAGNOSIS — M9901 Segmental and somatic dysfunction of cervical region: Secondary | ICD-10-CM | POA: Diagnosis not present

## 2022-07-06 DIAGNOSIS — M9903 Segmental and somatic dysfunction of lumbar region: Secondary | ICD-10-CM | POA: Diagnosis not present

## 2022-08-03 DIAGNOSIS — M9902 Segmental and somatic dysfunction of thoracic region: Secondary | ICD-10-CM | POA: Diagnosis not present

## 2022-08-03 DIAGNOSIS — M9905 Segmental and somatic dysfunction of pelvic region: Secondary | ICD-10-CM | POA: Diagnosis not present

## 2022-08-03 DIAGNOSIS — M9903 Segmental and somatic dysfunction of lumbar region: Secondary | ICD-10-CM | POA: Diagnosis not present
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
## 2023-02-20 ENCOUNTER — Encounter (HOSPITAL_COMMUNITY): Payer: Self-pay | Admitting: Obstetrics and Gynecology

## 2023-02-20 ENCOUNTER — Other Ambulatory Visit: Payer: Self-pay

## 2023-02-20 NOTE — Progress Notes (Signed)
PCP - Jarrett Soho, PA-C Cardiologist -   PPM/ICD - denies Device Orders - n/a Rep Notified - n/a  Chest x-ray - denies EKG - denies Stress Test - denies ECHO - denies Cardiac Cath - denies  DM denies  Blood Thinner Instructions: denies Aspirin Instructions: n/a  ERAS Protcol - NPO  COVID TEST- n/a  Anesthesia review: yes hx of heart murmur  Patient verbally denies any shortness of breath, fever, cough and chest pain during phone call   -------------  SDW INSTRUCTIONS given:  Your procedure is scheduled on [facility] February 21, 2023.  Report to [facility] Main Entrance "A" at 12:15 P.M., and check in at the Admitting office.  Call this number if you have problems the morning of surgery:  [phone]   Remember:  Do not eat or drink after midnight the night before your surgery      Take these medicines the morning of surgery with A SIP OF WATER  cetirizine (ZYRTEC)   IF NEEDED  As of today, STOP taking any Aspirin (unless otherwise instructed by your surgeon) Aleve, Naproxen, Ibuprofen, Motrin, Advil, Goody's, BC's, all herbal medications, fish oil, and all vitamins.                      Do not wear jewelry, make up, or nail polish            Do not wear lotions, powders, perfumes/colognes, or deodorant.            Do not shave 48 hours prior to surgery.  Men may shave face and neck.            Do not bring valuables to the hospital.            [facility] is not responsible for any belongings or valuables.  Do NOT Smoke (Tobacco/Vaping) 24 hours prior to your procedure If you use a CPAP at night, you may bring all equipment for your overnight stay.   Contacts, glasses, dentures or bridgework may not be worn into surgery.      For patients admitted to the hospital, discharge time will be determined by your treatment team.   Patients discharged the day of surgery will not be allowed to drive home, and someone needs to stay with them for 24  hours.    Special instructions:   - Preparing For Surgery  Before surgery, you can play an important role. Because skin is not sterile, your skin needs to be as free of germs as possible. You can reduce the number of germs on your skin by washing with CHG (chlorahexidine gluconate) Soap before surgery.  CHG is an antiseptic cleaner which kills germs and bonds with the skin to continue killing germs even after washing.    Oral Hygiene is also important to reduce your risk of infection.  Remember - BRUSH YOUR TEETH THE MORNING OF SURGERY WITH YOUR REGULAR TOOTHPASTE  Please do not use if you have an allergy to CHG or antibacterial soaps. If your skin becomes reddened/irritated stop using the CHG.  Do not shave (including legs and underarms) for at least 48 hours prior to first CHG shower. It is OK to shave your face.  Please follow these instructions carefully.   Shower the NIGHT BEFORE SURGERY and the MORNING OF SURGERY with DIAL Soap.   Pat yourself dry with a CLEAN TOWEL.  Wear CLEAN PAJAMAS to bed the night before surgery  Place CLEAN SHEETS on  your bed the night of your first shower and DO NOT SLEEP WITH PETS.   Day of Surgery: Please shower morning of surgery  Wear Clean/Comfortable clothing the morning of surgery Do not apply any deodorants/lotions.   Remember to brush your teeth WITH YOUR REGULAR TOOTHPASTE.   Questions were answered. Patient verbalized understanding of instructions.

## 2023-02-21 ENCOUNTER — Encounter (HOSPITAL_COMMUNITY)
Admission: RE | Disposition: A | Discharge status: Home / Self Care | Payer: Self-pay | Source: Home / Self Care | Attending: Obstetrics and Gynecology

## 2023-02-21 ENCOUNTER — Ambulatory Visit (HOSPITAL_COMMUNITY)
Admission: RE | Admit: 2023-02-21 | Discharge: 2023-02-21 | Disposition: A | Discharge status: Home / Self Care | Attending: Obstetrics and Gynecology | Admitting: Obstetrics and Gynecology

## 2023-02-21 ENCOUNTER — Ambulatory Visit (HOSPITAL_COMMUNITY): Admitting: Physician Assistant

## 2023-02-21 ENCOUNTER — Other Ambulatory Visit: Payer: Self-pay

## 2023-02-21 ENCOUNTER — Ambulatory Visit (HOSPITAL_BASED_OUTPATIENT_CLINIC_OR_DEPARTMENT_OTHER): Admitting: Physician Assistant

## 2023-02-21 ENCOUNTER — Encounter (HOSPITAL_COMMUNITY): Payer: Self-pay | Admitting: Obstetrics and Gynecology

## 2023-02-21 DIAGNOSIS — O021 Missed abortion: Secondary | ICD-10-CM | POA: Diagnosis present

## 2023-02-21 DIAGNOSIS — Z3A10 10 weeks gestation of pregnancy: Secondary | ICD-10-CM

## 2023-02-21 HISTORY — DX: Depression, unspecified: F32.A

## 2023-02-21 HISTORY — PX: DILATION AND EVACUATION: SHX1459

## 2023-02-21 LAB — HIV ANTIBODY (ROUTINE TESTING W REFLEX): HIV Screen 4th Generation wRfx: NONREACTIVE

## 2023-02-21 LAB — CBC
HCT: 38.5 % (ref 36.0–46.0)
Hemoglobin: 13.3 g/dL (ref 12.0–15.0)
MCH: 30.1 pg (ref 26.0–34.0)
MCHC: 34.5 g/dL (ref 30.0–36.0)
MCV: 87.1 fL (ref 80.0–100.0)
Platelets: 270 10*3/uL (ref 150–400)
RBC: 4.42 MIL/uL (ref 3.87–5.11)
RDW: 13.1 % (ref 11.5–15.5)
WBC: 7.2 10*3/uL (ref 4.0–10.5)
nRBC: 0 % (ref 0.0–0.2)

## 2023-02-21 LAB — TYPE AND SCREEN
ABO/RH(D): O POS
Antibody Screen: NEGATIVE

## 2023-02-21 SURGERY — DILATION AND EVACUATION, UTERUS
Anesthesia: General | Site: Vagina

## 2023-02-21 MED ORDER — SILVER NITRATE-POT NITRATE 75-25 % EX MISC
CUTANEOUS | Status: AC
  Filled 2023-02-21: qty 10

## 2023-02-21 MED ORDER — ONDANSETRON HCL 4 MG/2ML IJ SOLN
INTRAMUSCULAR | Status: DC | PRN
Start: 2023-02-21 — End: 2023-02-21
  Administered 2023-02-21: 4 mg via INTRAVENOUS

## 2023-02-21 MED ORDER — LIDOCAINE 2% (20 MG/ML) 5 ML SYRINGE
INTRAMUSCULAR | Status: DC | PRN
  Administered 2023-02-21: 60 mg via INTRAVENOUS

## 2023-02-21 MED ORDER — LIDOCAINE 2% (20 MG/ML) 5 ML SYRINGE
INTRAMUSCULAR | Status: AC
  Filled 2023-02-21: qty 5

## 2023-02-21 MED ORDER — LACTATED RINGERS IV SOLN
INTRAVENOUS | Status: DC | PRN
Start: 2023-02-21 — End: 2023-02-21

## 2023-02-21 MED ORDER — MIDAZOLAM HCL 2 MG/2ML IJ SOLN
INTRAMUSCULAR | Status: DC | PRN
  Administered 2023-02-21: 2 mg via INTRAVENOUS

## 2023-02-21 MED ORDER — CHLOROPROCAINE HCL 1 % IJ SOLN
INTRAMUSCULAR | Status: AC
  Filled 2023-02-21: qty 30

## 2023-02-21 MED ORDER — CELECOXIB 200 MG PO CAPS
200.0000 mg | ORAL_CAPSULE | Freq: Once | ORAL | Status: AC
  Administered 2023-02-21: 200 mg via ORAL
  Filled 2023-02-21: qty 1

## 2023-02-21 MED ORDER — DEXAMETHASONE SODIUM PHOSPHATE 10 MG/ML IJ SOLN
INTRAMUSCULAR | Status: AC
  Filled 2023-02-21: qty 1

## 2023-02-21 MED ORDER — FENTANYL CITRATE (PF) 250 MCG/5ML IJ SOLN
INTRAMUSCULAR | Status: DC | PRN
  Administered 2023-02-21: 50 ug via INTRAVENOUS

## 2023-02-21 MED ORDER — ACETAMINOPHEN 500 MG PO TABS
1000.0000 mg | ORAL_TABLET | Freq: Once | ORAL | Status: AC
  Administered 2023-02-21: 1000 mg via ORAL
  Filled 2023-02-21: qty 2

## 2023-02-21 MED ORDER — FENTANYL CITRATE (PF) 250 MCG/5ML IJ SOLN
INTRAMUSCULAR | Status: AC
  Filled 2023-02-21: qty 5

## 2023-02-21 MED ORDER — PROPOFOL 10 MG/ML IV BOLUS
INTRAVENOUS | Status: AC
  Filled 2023-02-21: qty 20

## 2023-02-21 MED ORDER — CHLOROPROCAINE HCL 1 % IJ SOLN
INTRAMUSCULAR | Status: DC | PRN
  Administered 2023-02-21: 10 mL

## 2023-02-21 MED ORDER — PHENYLEPHRINE 80 MCG/ML (10ML) SYRINGE FOR IV PUSH (FOR BLOOD PRESSURE SUPPORT)
PREFILLED_SYRINGE | INTRAVENOUS | Status: AC
  Filled 2023-02-21: qty 10

## 2023-02-21 MED ORDER — ONDANSETRON HCL 4 MG/2ML IJ SOLN
INTRAMUSCULAR | Status: AC
  Filled 2023-02-21: qty 2

## 2023-02-21 MED ORDER — METHYLERGONOVINE MALEATE 0.2 MG/ML IJ SOLN
INTRAMUSCULAR | Status: AC
  Filled 2023-02-21: qty 1

## 2023-02-21 MED ORDER — POVIDONE-IODINE 10 % EX SWAB
2.0000 | Freq: Once | CUTANEOUS | Status: AC
  Administered 2023-02-21: 2 via TOPICAL

## 2023-02-21 MED ORDER — CHLORHEXIDINE GLUCONATE 0.12 % MT SOLN
OROMUCOSAL | Status: AC
  Administered 2023-02-21: 15 mL
  Filled 2023-02-21: qty 15

## 2023-02-21 MED ORDER — DEXAMETHASONE SODIUM PHOSPHATE 10 MG/ML IJ SOLN
INTRAMUSCULAR | Status: DC | PRN
  Administered 2023-02-21: 5 mg via INTRAVENOUS

## 2023-02-21 MED ORDER — MIDAZOLAM HCL 2 MG/2ML IJ SOLN
INTRAMUSCULAR | Status: AC
  Filled 2023-02-21: qty 2

## 2023-02-21 MED ORDER — PROPOFOL 10 MG/ML IV BOLUS
INTRAVENOUS | Status: DC | PRN
  Administered 2023-02-21: 200 mg via INTRAVENOUS

## 2023-02-21 MED ORDER — TRANEXAMIC ACID-NACL 1000-0.7 MG/100ML-% IV SOLN
1000.0000 mg | Freq: Once | INTRAVENOUS | Status: AC
  Administered 2023-02-21: 1000 mg via INTRAVENOUS
  Filled 2023-02-21: qty 100

## 2023-02-21 SURGICAL SUPPLY — 20 items
DRSG TELFA 3X8 NADH STRL (GAUZE/BANDAGES/DRESSINGS) ×1 IMPLANT
FILTER UTR ASPR ASSEMBLY (MISCELLANEOUS) ×1 IMPLANT
GLOVE BIO SURGEON STRL SZ 6.5 (GLOVE) ×1 IMPLANT
GLOVE BIOGEL PI IND STRL 7.0 (GLOVE) ×2 IMPLANT
GOWN STRL REUS W/ TWL LRG LVL3 (GOWN DISPOSABLE) ×2 IMPLANT
GOWN STRL REUS W/TWL LRG LVL3 (GOWN DISPOSABLE) ×2
HOSE CONNECTING 18IN BERKELEY (TUBING) ×1 IMPLANT
KIT BERKELEY 1ST TRI 3/8 NO TR (MISCELLANEOUS) ×1 IMPLANT
KIT BERKELEY 1ST TRIMESTER 3/8 (MISCELLANEOUS) ×1 IMPLANT
NS IRRIG 1000ML POUR BTL (IV SOLUTION) ×1 IMPLANT
PACK VAGINAL MINOR WOMEN LF (CUSTOM PROCEDURE TRAY) ×1 IMPLANT
PAD OB MATERNITY 4.3X12.25 (PERSONAL CARE ITEMS) ×1 IMPLANT
SET BERKELEY SUCTION TUBING (SUCTIONS) ×1 IMPLANT
SPIKE FLUID TRANSFER (MISCELLANEOUS) ×1 IMPLANT
TOWEL GREEN STERILE FF (TOWEL DISPOSABLE) ×2 IMPLANT
UNDERPAD 30X36 HEAVY ABSORB (UNDERPADS AND DIAPERS) ×1 IMPLANT
VACURETTE 10 RIGID CVD (CANNULA) IMPLANT
VACURETTE 7MM CVD STRL WRAP (CANNULA) IMPLANT
VACURETTE 8 RIGID CVD (CANNULA) IMPLANT
VACURETTE 9 RIGID CVD (CANNULA) IMPLANT

## 2023-02-21 NOTE — Op Note (Signed)
PREOPERATIVE DIAGNOSES: 1. Missed Abortion in 1st trimester  POSTOPERATIVE DIAGNOSES: Same  PROCEDURE PERFORMED: Dilation, suction, sharp curretage  SURGEON: Dr. Belva Agee  ANESTHESIA: Paracervical block and IV sedation  ESTIMATED BLOOD LOSS: 50 cc.  COMPLICATIONS: None  TUBES: None.  DRAINS: None  PATHOLOGY: Endometrial curettings  FINDINGS: On exam, under anesthesia, normal appearing vulva and vagina, 7 week sized uterus  Operative findings demonstrated plethora of POCs   Procedure: The patient was taken to the operating room where she was properly prepped and draped in sterile manner under general anesthesia. After bimanual examination, the cervix was exposed with a speculum and the anterior lip of the cervix grasped with a tenaculum. Paracervical block performed. The endocervical canal was then progressively dilated to 7mm. Suction catheter was introduced into the uterus and to the uterine fundus. The uterus was evacuated and good tissue return was noted. A sharp curettage was then performed until gritty texture noted. All instruments were removed from vagina. The sponge and lap counts were correct times 2 at this time. The patient's procedure was terminated. We then awakened her. She was sent to the Recovery Room in good condition.    Belva Agee MD

## 2023-02-21 NOTE — Progress Notes (Signed)
No updates to H and p. Arrived to PACU NPO. Proceed with scheduled procedure. Declines genetic testing.    Rosie Fate MD

## 2023-02-21 NOTE — Anesthesia Procedure Notes (Addendum)
Procedure Name: LMA Insertion Date/Time: 02/21/2023 4:17 PM  Performed by: Camillia Herter, CRNAPre-anesthesia Checklist: Patient identified, Emergency Drugs available, Suction available and Patient being monitored Patient Re-evaluated:Patient Re-evaluated prior to induction Oxygen Delivery Method: Circle System Utilized Preoxygenation: Pre-oxygenation with 100% oxygen Induction Type: IV induction Ventilation: Mask ventilation without difficulty LMA: LMA inserted LMA Size: 4.0 Number of attempts: 1 Placement Confirmation: positive ETCO2 Tube secured with: Tape Dental Injury: Teeth and Oropharynx as per pre-operative assessment

## 2023-02-21 NOTE — Anesthesia Preprocedure Evaluation (Signed)
Anesthesia Evaluation  Patient identified by MRN, date of birth, ID band Patient awake    Reviewed: Allergy & Precautions, NPO status , Patient's Chart, lab work & pertinent test results  Airway Mallampati: I  TM Distance: >3 FB Neck ROM: Full    Dental no notable dental hx.    Pulmonary neg pulmonary ROS, Patient abstained from smoking.   Pulmonary exam normal        Cardiovascular negative cardio ROS  Rhythm:Regular Rate:Normal     Neuro/Psych  Headaches  Anxiety Depression       GI/Hepatic Neg liver ROS,GERD  ,,  Endo/Other  negative endocrine ROS    Renal/GU negative Renal ROS  Female GU complaint Missed Ab    Musculoskeletal negative musculoskeletal ROS (+)    Abdominal Normal abdominal exam  (+)   Peds  Hematology  (+) Blood dyscrasia, anemia Lab Results      Component                Value               Date                      WBC                      18.5 (H)            12/09/2019                HGB                      10.0 (L)            12/09/2019                HCT                      30.1 (L)            12/09/2019                MCV                      88.0                12/09/2019                PLT                      211                 12/09/2019              Anesthesia Other Findings   Reproductive/Obstetrics                              Anesthesia Physical Anesthesia Plan  ASA: 2  Anesthesia Plan: General   Post-op Pain Management: Celebrex PO (pre-op)* and Tylenol PO (pre-op)*   Induction: Intravenous  PONV Risk Score and Plan: 3 and Ondansetron, Dexamethasone, Midazolam and Treatment may vary due to age or medical condition  Airway Management Planned: Mask and LMA  Additional Equipment: None  Intra-op Plan:   Post-operative Plan: Extubation in OR  Informed Consent: I have reviewed the patients History and Physical, chart, labs and discussed the  procedure including the risks, benefits and alternatives for the  proposed anesthesia with the patient or authorized representative who has indicated his/her understanding and acceptance.     Dental advisory given  Plan Discussed with: CRNA  Anesthesia Plan Comments:          Anesthesia Quick Evaluation

## 2023-02-21 NOTE — Transfer of Care (Signed)
Immediate Anesthesia Transfer of Care Note  Patient: Ashley Jensen  Procedure(s) Performed: DILATATION AND EVACUATION (Vagina )  Patient Location: PACU  Anesthesia Type:General  Level of Consciousness: awake, alert , and oriented  Airway & Oxygen Therapy: Patient Spontanous Breathing  Post-op Assessment: Report given to RN and Post -op Vital signs reviewed and stable  Post vital signs: Reviewed and stable  Last Vitals:  Vitals Value Taken Time  BP 123/82 02/21/23 1645  Temp    Pulse 53 02/21/23 1650  Resp 19 02/21/23 1650  SpO2 99 % 02/21/23 1650  Vitals shown include unfiled device data.  Last Pain:  Vitals:   02/21/23 1323  PainSc: 0-No pain         Complications: No notable events documented.

## 2023-02-22 ENCOUNTER — Encounter (HOSPITAL_COMMUNITY): Payer: Self-pay | Admitting: Obstetrics and Gynecology

## 2023-02-22 LAB — RPR: RPR Ser Ql: NONREACTIVE

## 2023-02-22 NOTE — Anesthesia Postprocedure Evaluation (Signed)
Anesthesia Post Note  Patient: Ashley Jensen  Procedure(s) Performed: DILATATION AND EVACUATION (Vagina )     Patient location during evaluation: PACU Anesthesia Type: General Level of consciousness: awake and alert Pain management: pain level controlled Vital Signs Assessment: post-procedure vital signs reviewed and stable Respiratory status: spontaneous breathing, nonlabored ventilation, respiratory function stable and patient connected to nasal cannula oxygen Cardiovascular status: blood pressure returned to baseline and stable Postop Assessment: no apparent nausea or vomiting Anesthetic complications: no  No notable events documented.  Last Vitals:  Vitals:   02/21/23 1715 02/21/23 1723  BP: 121/73 117/73  Pulse: (!) 53 (!) 51  Resp: 19 15  Temp:  36.7 C  SpO2: 96% 99%    Last Pain:  Vitals:   02/21/23 1723  PainSc: 0-No pain                 Vanessa Kampf L Bjorn Hallas

## 2023-02-23 LAB — SURGICAL PATHOLOGY
